# Patient Record
Sex: Male | Born: 1950 | Race: White | Hispanic: No | Marital: Married | Smoking: Never smoker
Health system: Southern US, Community
[De-identification: ages and names within clinical notes are randomized; demographics above are authoritative.]

## PROBLEM LIST (undated history)

## (undated) DIAGNOSIS — Z9889 Other specified postprocedural states: Secondary | ICD-10-CM

## (undated) DIAGNOSIS — R112 Nausea with vomiting, unspecified: Secondary | ICD-10-CM

## (undated) DIAGNOSIS — I10 Essential (primary) hypertension: Secondary | ICD-10-CM

## (undated) DIAGNOSIS — H409 Unspecified glaucoma: Secondary | ICD-10-CM

## (undated) DIAGNOSIS — N200 Calculus of kidney: Secondary | ICD-10-CM

## (undated) DIAGNOSIS — K8050 Calculus of bile duct without cholangitis or cholecystitis without obstruction: Secondary | ICD-10-CM

## (undated) HISTORY — PX: HERNIA REPAIR: SHX51

---

## 1997-11-07 ENCOUNTER — Ambulatory Visit (HOSPITAL_COMMUNITY): Admission: RE | Admit: 1997-11-07 | Discharge: 1997-11-07 | Payer: Self-pay | Admitting: Family Medicine

## 1998-07-05 ENCOUNTER — Ambulatory Visit (HOSPITAL_COMMUNITY): Admission: RE | Admit: 1998-07-05 | Discharge: 1998-07-05 | Payer: Self-pay | Admitting: Gastroenterology

## 1999-10-12 ENCOUNTER — Ambulatory Visit (HOSPITAL_COMMUNITY): Admission: RE | Admit: 1999-10-12 | Discharge: 1999-10-12 | Payer: Self-pay | Admitting: Family Medicine

## 1999-10-12 ENCOUNTER — Encounter: Payer: Self-pay | Admitting: Family Medicine

## 2001-03-22 ENCOUNTER — Encounter: Admission: RE | Admit: 2001-03-22 | Discharge: 2001-03-22 | Payer: Self-pay | Admitting: Otolaryngology

## 2001-03-22 ENCOUNTER — Encounter: Payer: Self-pay | Admitting: Otolaryngology

## 2001-07-24 ENCOUNTER — Encounter: Payer: Self-pay | Admitting: Family Medicine

## 2001-07-24 ENCOUNTER — Ambulatory Visit (HOSPITAL_COMMUNITY): Admission: RE | Admit: 2001-07-24 | Discharge: 2001-07-24 | Payer: Self-pay | Admitting: Family Medicine

## 2003-04-28 ENCOUNTER — Ambulatory Visit (HOSPITAL_COMMUNITY): Admission: RE | Admit: 2003-04-28 | Discharge: 2003-04-28 | Payer: Self-pay | Admitting: Family Medicine

## 2004-08-07 ENCOUNTER — Emergency Department (HOSPITAL_COMMUNITY): Admission: EM | Admit: 2004-08-07 | Discharge: 2004-08-07 | Payer: Self-pay | Admitting: Emergency Medicine

## 2006-07-20 ENCOUNTER — Ambulatory Visit (HOSPITAL_COMMUNITY): Admission: RE | Admit: 2006-07-20 | Discharge: 2006-07-20 | Payer: Self-pay | Admitting: General Surgery

## 2009-06-27 ENCOUNTER — Emergency Department (HOSPITAL_COMMUNITY): Admission: EM | Admit: 2009-06-27 | Discharge: 2009-06-27 | Payer: Self-pay | Admitting: Emergency Medicine

## 2010-03-04 ENCOUNTER — Encounter
Admission: RE | Admit: 2010-03-04 | Discharge: 2010-03-04 | Payer: Self-pay | Source: Home / Self Care | Attending: Family Medicine | Admitting: Family Medicine

## 2010-08-09 NOTE — Op Note (Signed)
NAME:  Darren Ponce NO.:  [id number]   MEDICAL RECORD NO.:  [id number]          PATIENT TYPE:  AMB   LOCATION:  DAY                          FACILITY:  [facility]   PHYSICIAN:  Leonie Man, M.D.   DATE OF BIRTH:  07/27/1950   DATE OF PROCEDURE:  07/20/2006  DATE OF DISCHARGE:                               OPERATIVE REPORT   PREOPERATIVE DIAGNOSIS:  Bilateral inguinal hernias.   POSTOPERATIVE DIAGNOSIS:  Bilateral inguinal hernias.   PROCEDURE:  Repair of bilateral inguinal hernias with mesh  Armanda Heritage).   SURGEON:  Leonie Man, M.D.   ASSISTANT:  OR tech.   ANESTHESIA:  General.   There are no specimens.   ESTIMATED BLOOD LOSS:  Minimal.   No complications and the patient returned to the PACU in excellent  condition.   [name] is a 60 year old recently retired man who noted a large left-  sided groin bulge after having moved some heavy equipment.  This has  persisted and he was referred for evaluation and treatment.  On  evaluation, the patient does have a large left-sided inguinal hernia  and, in fact, he does have a right-sided inguinal hernia.  The risks and  potential benefits of surgery as well as the details of the procedure  are discussed with the patient, he understands and gives his consent to  surgery.  He comes in to the operating room now for repair of his  bilateral inguinal hernias.   PROCEDURE:  The patient is positioned supine following the induction of  satisfactory general anesthesia and the lower abdomen is prepped and  draped included in the sterile operative field.  A transverse incision  in the lower abdominal crease on the left side is deepened through the  skin and subcutaneous tissue, dissecting down to the external oblique  aponeurosis.  This was opened up through the external inguinal ring with  retraction of the ilioinguinal nerve and elevation of the spermatic cord  which is held with a Penrose drain.   Dissection along the anteromedial  and superior aspect of the spermatic cord did not reveal an indirect  hernia.  However, on the inguinal floor, close to the pubic tubercle,  there was a rather large and prolapsed direct inguinal hernia.  This was  repaired by an onlay patch of Marlex polypropylene mesh which was sewn  in at the pubic tubercle with #2-0 Novofil and then carried up along the  conjoined tendon to the internal ring in the running fashion, and again  from the pubic tubercle along the shelving edge of Poupart's ligament up  to the internal ring.  The mesh was split so as to allow protrusion of  the spermatic cord.  The remaining details of the mesh were then sutured  down into the internal oblique muscle behind the cord.  The newly formed  internal ring was tested, noted to be patent without any constriction on  the spermatic cord.  Sponge instrument and sharp counts were then  verified on the left side.  The external oblique aponeurosis was closed  over the  spermatic cord with a running #2-0 Vicryl suture.  The Scarpa's  fascia was closed with a running #3-0 Vicryl suture and the skin closed  with running #4-0 Monocryl suture.   Attention was then turned to the right side where a symmetrically placed  transverse incision in the lower abdominal crease was carried down  through the skin and subcutaneous tissue, dissecting down to the  external oblique aponeurosis.  Again this was opened up through the  external inguinal ring with retraction of the ilioinguinal nerve and  elevation of the spermatic cord from the inguinal floor and held with a  Penrose drain.  Again dissection in the region of the anteromedial  aspect of the spermatic cord did not reveal an indirect sac.  The entire  floor of the right side inguinal canal was replaced with a rather large  defect in the transversalis fascia.  This was repaired by an onlay patch  of polypropylene mesh which again was sewn in at  the pubic tubercle with  #2-0 Novofil and carried up in a running suture along the conjoined  tendon up to the internal ring and again from the pubic tubercle up  along the shelving edge of Poupart's ligament to the internal ring.  The  mesh was split so as to allow proper protrusion off the cord through the  mesh and the tails of the mesh were sutured down to the internal oblique  muscle, thus forming a new internal ring.  Again the internal ring was  checked for it's patency and there was no stricturing off the cord.  The  external oblique aponeurosis was then closed after sponge and instrument  counts were verified.  This was done with a #2-0 Novofil suture.  The  Scarpa's fascia closed with a #3-0 Vicryl suture and the skin was closed  with #4-0 Monocryl suture.  Steri-Strips were placed in both wounds,  compressive dressings applied.  The anesthetic reversed and the patient  removed from the operating room to the recovery room in stable  condition.  He tolerated the procedure well.      Leonie Man, M.D.  Electronically Signed     PB/MEDQ  D:  07/20/2006  T:  07/20/2006  Job:  [id number]   cc:   Lindaann Slough, M.D.  Fax: [phone]

## 2012-08-20 ENCOUNTER — Emergency Department (HOSPITAL_COMMUNITY)
Admission: EM | Admit: 2012-08-20 | Discharge: 2012-08-20 | Disposition: A | Discharge status: Home / Self Care | Payer: 59 | Attending: Emergency Medicine | Admitting: Emergency Medicine

## 2012-08-20 ENCOUNTER — Emergency Department (HOSPITAL_COMMUNITY): Payer: 59

## 2012-08-20 ENCOUNTER — Encounter (HOSPITAL_COMMUNITY): Payer: Self-pay | Admitting: *Deleted

## 2012-08-20 DIAGNOSIS — R109 Unspecified abdominal pain: Secondary | ICD-10-CM

## 2012-08-20 DIAGNOSIS — M549 Dorsalgia, unspecified: Secondary | ICD-10-CM | POA: Insufficient documentation

## 2012-08-20 DIAGNOSIS — Z87442 Personal history of urinary calculi: Secondary | ICD-10-CM | POA: Insufficient documentation

## 2012-08-20 DIAGNOSIS — Z79899 Other long term (current) drug therapy: Secondary | ICD-10-CM | POA: Insufficient documentation

## 2012-08-20 DIAGNOSIS — R1011 Right upper quadrant pain: Secondary | ICD-10-CM | POA: Insufficient documentation

## 2012-08-20 HISTORY — DX: Calculus of kidney: N20.0

## 2012-08-20 LAB — COMPREHENSIVE METABOLIC PANEL
ALT: 18 U/L (ref 0–53)
Albumin: 4.1 g/dL (ref 3.5–5.2)
CO2: 25 mEq/L (ref 19–32)
Calcium: 9.5 mg/dL (ref 8.4–10.5)
Creatinine, Ser: 1.19 mg/dL (ref 0.50–1.35)
GFR calc Af Amer: 74 mL/min — ABNORMAL LOW (ref >90)
GFR calc non Af Amer: 64 mL/min — ABNORMAL LOW (ref >90)
Glucose, Bld: 167 mg/dL — ABNORMAL HIGH (ref 70–99)
Potassium: 4.4 mEq/L (ref 3.5–5.1)
Sodium: 136 mEq/L (ref 135–145)
Total Protein: 7.3 g/dL (ref 6.0–8.3)

## 2012-08-20 LAB — URINALYSIS, ROUTINE W REFLEX MICROSCOPIC
Bilirubin Urine: NEGATIVE
Glucose, UA: 100 mg/dL — AB
Hgb urine dipstick: NEGATIVE
Ketones, ur: NEGATIVE mg/dL
Leukocytes, UA: NEGATIVE
Nitrite: NEGATIVE
Protein, ur: NEGATIVE mg/dL
Specific Gravity, Urine: 1.022 (ref 1.005–1.030)
Urobilinogen, UA: 0.2 mg/dL (ref 0.0–1.0)
pH: 6 (ref 5.0–8.0)

## 2012-08-20 LAB — CBC WITH DIFFERENTIAL/PLATELET
Basophils Absolute: 0 10*3/uL (ref 0.0–0.1)
Eosinophils Absolute: 0 10*3/uL (ref 0.0–0.7)
Eosinophils Relative: 0 % (ref 0–5)
MCH: 31.5 pg (ref 26.0–34.0)
MCHC: 36.4 g/dL — ABNORMAL HIGH (ref 30.0–36.0)
Neutrophils Relative %: 87 % — ABNORMAL HIGH (ref 43–77)
Platelets: 154 10*3/uL (ref 150–400)
RDW: 12.4 % (ref 11.5–15.5)

## 2012-08-20 LAB — LIPASE, BLOOD: Lipase: 33 U/L (ref 11–59)

## 2012-08-20 MED ORDER — NAPROXEN 500 MG PO TABS: 500.0000 mg | ORAL_TABLET | Freq: Two times a day (BID) | ORAL | Status: DC

## 2012-08-20 MED ORDER — HYDROCODONE-ACETAMINOPHEN 5-325 MG PO TABS: 2.0000 | ORAL_TABLET | ORAL | Status: DC | PRN

## 2012-08-20 MED ORDER — SODIUM CHLORIDE 0.9 % IV SOLN
Freq: Once | INTRAVENOUS | Status: AC
  Administered 2012-08-20: 09:00:00 via INTRAVENOUS

## 2012-08-20 MED ORDER — KETOROLAC TROMETHAMINE 30 MG/ML IJ SOLN
30.0000 mg | Freq: Once | INTRAMUSCULAR | Status: AC
  Administered 2012-08-20: 30 mg via INTRAVENOUS
  Filled 2012-08-20: qty 1

## 2012-08-20 MED ORDER — ONDANSETRON HCL 4 MG/2ML IJ SOLN
4.0000 mg | Freq: Once | INTRAMUSCULAR | Status: AC
  Administered 2012-08-20: 4 mg via INTRAVENOUS
  Filled 2012-08-20: qty 2

## 2012-08-20 MED ORDER — MORPHINE SULFATE 4 MG/ML IJ SOLN
4.0000 mg | Freq: Once | INTRAMUSCULAR | Status: AC
  Administered 2012-08-20: 4 mg via INTRAVENOUS
  Filled 2012-08-20: qty 1

## 2012-08-20 NOTE — ED Provider Notes (Signed)
History     CSN: [id number]  Arrival date & time 08/20/12  0815   First MD Initiated Contact with Patient 08/20/12 [phone]      Chief Complaint  Patient presents with  . Abdominal Pain    (Consider location/radiation/quality/duration/timing/severity/associated sxs/prior treatment) HPI Comments: Pt is an otherwise healthy 62 y/o male with hx of KS 10 years ago and hx of hernia repair in the past (inguinal) who presents with CC of abd pain.  This was acute in onset at 1:30 AM.  This is poorly described but has not radiated, seems to stay in the same location and has no associated blood in the urine, no vomiting and no diarrhea.  He has no increased pain with coughing, no pain with urination and no dark colored urine.  No stool abnormalities.  This has been fairly constant.  He doesn't feel as though this is the same as prior KS.  No f/c/cough/sob.  He does have some back pain which is on the R side lower Thoracic area which he attributes to his work.    The history is provided by the patient.    Past Medical History  Diagnosis Date  . Kidney stone     Past Surgical History  Procedure Laterality Date  . Hernia repair      No family history on file.  History  Substance Use Topics  . Smoking status: Never Smoker   . Smokeless tobacco: Not on file  . Alcohol Use: Yes     Comment: occasionally      Review of Systems  All other systems reviewed and are negative.    Allergies  Codeine  Home Medications   Current Outpatient Rx  Name  Route  Sig  Dispense  Refill  . acetaminophen (TYLENOL) 500 MG tablet   Oral   Take 1,000 mg by mouth every 6 (six) hours as needed for pain.         . cyclobenzaprine (FLEXERIL) 10 MG tablet   Oral   Take 5 mg by mouth once.         Marland Kitchen HYDROcodone-acetaminophen (NORCO/VICODIN) 5-325 MG per tablet   Oral   Take 2 tablets by mouth every 4 (four) hours as needed for pain.   10 tablet   0   . naproxen (NAPROSYN) 500 MG tablet    Oral   Take 1 tablet (500 mg total) by mouth 2 (two) times daily with a meal.   30 tablet   0     BP 163/78  Pulse 66  Temp(Src) 97.8 F (36.6 C) (Oral)  Resp 20  Ht 5\' 9"  (1.753 m)  Wt 200 lb (90.719 kg)  BMI 29.52 kg/m2  SpO2 95%  Physical Exam  Nursing note and vitals reviewed. Constitutional: He appears well-developed and well-nourished. No distress.  HENT:  Head: Normocephalic and atraumatic.  Mouth/Throat: Oropharynx is clear and moist. No oropharyngeal exudate.  Eyes: Conjunctivae and EOM are normal. Pupils are equal, round, and reactive to light. Right eye exhibits no discharge. Left eye exhibits no discharge. No scleral icterus.  Neck: Normal range of motion. Neck supple. No JVD present. No thyromegaly present.  Cardiovascular: Normal rate, regular rhythm, normal heart sounds and intact distal pulses.  Exam reveals no gallop and no friction rub.   No murmur heard. Pulmonary/Chest: Effort normal and breath sounds normal. No respiratory distress. He has no wheezes. He has no rales.  Abdominal: Soft. Bowel sounds are normal. He exhibits no distension and no  mass. There is tenderness ( mild ttp to the RUQ - no guarding, very soft).  No pain at [facility] B point, no guarding, no masses, no peritoneal signs.  No CVA ttp.  Musculoskeletal: Normal range of motion. He exhibits no edema and no tenderness.  Lymphadenopathy:    He has no cervical adenopathy.  Neurological: He is alert. Coordination normal.  Skin: Skin is warm and dry. No rash noted. No erythema.  Psychiatric: He has a normal mood and affect. His behavior is normal.    ED Course  Procedures (including critical care time)  Labs Reviewed  CBC WITH DIFFERENTIAL - Abnormal; Notable for the following:    MCHC 36.4 (*)    Neutrophils Relative % 87 (*)    Neutro Abs 8.0 (*)    Lymphocytes Relative 9 (*)    All other components within normal limits  COMPREHENSIVE METABOLIC PANEL - Abnormal; Notable for the following:     Glucose, Bld 167 (*)    GFR calc non Af Amer 64 (*)    GFR calc Af Amer 74 (*)    All other components within normal limits  URINALYSIS, ROUTINE W REFLEX MICROSCOPIC - Abnormal; Notable for the following:    Glucose, UA 100 (*)    All other components within normal limits  LIPASE, BLOOD   Ct Abdomen Pelvis Wo Contrast  08/20/2012   *RADIOLOGY REPORT*  Clinical Data: Chronic back pain, right upper quadrant abdominal pain, history of renal calculi, vomiting  CT ABDOMEN AND PELVIS WITHOUT CONTRAST  Technique:  Multidetector CT imaging of the abdomen and pelvis was performed following the standard protocol without intravenous contrast.  Comparison: 03/04/2010  Findings: Minor dependent basilar atelectasis versus scarring.  No lower lobe pneumonia.  Normal heart size.  No pericardial or pleural effusion.  Abdomen:  Kidneys demonstrate no acute obstruction, hydronephrosis, perinephric inflammatory process, or ureteral calculus on either side.  Ureters are symmetric and decompressed.  Hypoattenuation of the liver parenchyma compatible with hepatic steatosis.  No biliary dilatation.  The gallbladder, biliary system, pancreas, spleen, accessory splenule, and adrenal glands are within normal limits for a noncontrast study and demonstrate no acute process.  Negative for bowel obstruction, dilatation, ileus, or free air. Normal appendix.  No abdominal free fluid, fluid collection, hemorrhage, abscess, or adenopathy.  Pelvis:  No pelvic free fluid, fluid collection, hemorrhage, abscess, or adenopathy.  Small fat containing left inguinal hernia as before.  Vascular calcifications noted.  Diffuse degenerative changes of the spine, pelvis and hips.  IMPRESSION: No acute obstructing urinary tract calculus or hydronephrosis.  Hepatic steatosis or fatty infiltration of the liver.  Normal appendix.  Small fat containing left inguinal hernia.   Original Report Authenticated By: Judie Petit. Shick, M.D.     1. Abdominal pain   2.  Back pain       MDM  The pt has no reproducible back tenderness, minimal abd ttp.  RUQ etiology consider cholecystitis / pancreatitis / KS.  meds ordered, UA and labs.  RUQ pain improved.  Labs normal - no hematuria - CT ordered to r/o large KS or other etiology of pain.  VS stable.  CT reviewed, no abnormal findings of acute concern - pt informed.  Stable for d/c.  Meds given in ED:  Medications  ketorolac (TORADOL) 30 MG/ML injection 30 mg (not administered)  0.9 %  sodium chloride infusion ( Intravenous New Bag/Given 08/20/12 0857)  morphine 4 MG/ML injection 4 mg (4 mg Intravenous Given 08/20/12 0857)  ondansetron (  ZOFRAN) injection 4 mg (4 mg Intravenous Given 08/20/12 0857)    New Prescriptions   HYDROCODONE-ACETAMINOPHEN (NORCO/VICODIN) 5-325 MG PER TABLET    Take 2 tablets by mouth every 4 (four) hours as needed for pain.   NAPROXEN (NAPROSYN) 500 MG TABLET    Take 1 tablet (500 mg total) by mouth 2 (two) times daily with a meal.        Vida Roller, MD 08/20/12 1006

## 2012-08-20 NOTE — ED Notes (Signed)
Bed:WA16<BR> Expected date:<BR> Expected time:<BR> Means of arrival:<BR> Comments:<BR> triage

## 2012-08-20 NOTE — ED Notes (Signed)
Pt reports chronic back pain and new onset of RUQ abd pain that began around 0130 this morning. Hx of kidney stones. Reports vomiting x1 this morning. Unable to describe pain, but states it is not sharp.

## 2012-09-01 ENCOUNTER — Inpatient Hospital Stay (HOSPITAL_COMMUNITY)
Admission: EM | Admit: 2012-09-01 | Discharge: 2012-09-05 | DRG: 418 | Disposition: A | Discharge status: Home / Self Care | Payer: 59 | Attending: Surgery | Admitting: Surgery

## 2012-09-01 ENCOUNTER — Emergency Department (HOSPITAL_COMMUNITY): Payer: 59

## 2012-09-01 ENCOUNTER — Inpatient Hospital Stay (HOSPITAL_COMMUNITY): Payer: 59

## 2012-09-01 ENCOUNTER — Encounter (HOSPITAL_COMMUNITY): Payer: Self-pay

## 2012-09-01 ENCOUNTER — Ambulatory Visit (HOSPITAL_COMMUNITY): Payer: 59

## 2012-09-01 DIAGNOSIS — R1013 Epigastric pain: Secondary | ICD-10-CM | POA: Diagnosis present

## 2012-09-01 DIAGNOSIS — K8020 Calculus of gallbladder without cholecystitis without obstruction: Secondary | ICD-10-CM

## 2012-09-01 DIAGNOSIS — K810 Acute cholecystitis: Secondary | ICD-10-CM | POA: Diagnosis present

## 2012-09-01 DIAGNOSIS — D72829 Elevated white blood cell count, unspecified: Secondary | ICD-10-CM | POA: Diagnosis present

## 2012-09-01 DIAGNOSIS — K8062 Calculus of gallbladder and bile duct with acute cholecystitis without obstruction: Principal | ICD-10-CM | POA: Diagnosis present

## 2012-09-01 DIAGNOSIS — R945 Abnormal results of liver function studies: Secondary | ICD-10-CM | POA: Diagnosis present

## 2012-09-01 DIAGNOSIS — K8050 Calculus of bile duct without cholangitis or cholecystitis without obstruction: Secondary | ICD-10-CM | POA: Diagnosis present

## 2012-09-01 DIAGNOSIS — I10 Essential (primary) hypertension: Secondary | ICD-10-CM | POA: Diagnosis present

## 2012-09-01 DIAGNOSIS — M549 Dorsalgia, unspecified: Secondary | ICD-10-CM

## 2012-09-01 DIAGNOSIS — R932 Abnormal findings on diagnostic imaging of liver and biliary tract: Secondary | ICD-10-CM

## 2012-09-01 DIAGNOSIS — R7989 Other specified abnormal findings of blood chemistry: Secondary | ICD-10-CM | POA: Diagnosis present

## 2012-09-01 DIAGNOSIS — K4090 Unilateral inguinal hernia, without obstruction or gangrene, not specified as recurrent: Secondary | ICD-10-CM | POA: Diagnosis present

## 2012-09-01 DIAGNOSIS — R748 Abnormal levels of other serum enzymes: Secondary | ICD-10-CM

## 2012-09-01 HISTORY — DX: Other specified postprocedural states: Z98.890

## 2012-09-01 HISTORY — DX: Nausea with vomiting, unspecified: R11.2

## 2012-09-01 HISTORY — DX: Calculus of bile duct without cholangitis or cholecystitis without obstruction: K80.50

## 2012-09-01 HISTORY — DX: Other specified postprocedural states: R11.2

## 2012-09-01 LAB — COMPREHENSIVE METABOLIC PANEL
ALT: 91 U/L — ABNORMAL HIGH (ref 0–53)
Albumin: 4.1 g/dL (ref 3.5–5.2)
Albumin: 3.8 g/dL (ref 3.5–5.2)
Alkaline Phosphatase: 130 U/L — ABNORMAL HIGH (ref 39–117)
BUN: 11 mg/dL (ref 6–23)
CO2: 26 mEq/L (ref 19–32)
Calcium: 10 mg/dL (ref 8.4–10.5)
Calcium: 9.3 mg/dL (ref 8.4–10.5)
Chloride: 99 mEq/L (ref 96–112)
Creatinine, Ser: 1.06 mg/dL (ref 0.50–1.35)
GFR calc Af Amer: 80 mL/min — ABNORMAL LOW (ref >90)
GFR calc Af Amer: 86 mL/min — ABNORMAL LOW (ref >90)
GFR calc non Af Amer: 69 mL/min — ABNORMAL LOW (ref >90)
Glucose, Bld: 157 mg/dL — ABNORMAL HIGH (ref 70–99)
Potassium: 3.9 mEq/L (ref 3.5–5.1)
Potassium: 4.4 mEq/L (ref 3.5–5.1)
Sodium: 136 mEq/L (ref 135–145)
Total Protein: 7.3 g/dL (ref 6.0–8.3)

## 2012-09-01 LAB — CBC WITH DIFFERENTIAL/PLATELET
Basophils Absolute: 0 10*3/uL (ref 0.0–0.1)
Eosinophils Absolute: 0 10*3/uL (ref 0.0–0.7)
HCT: 45.8 % (ref 39.0–52.0)
Hemoglobin: 15.7 g/dL (ref 13.0–17.0)
Lymphocytes Relative: 11 % — ABNORMAL LOW (ref 12–46)
Lymphocytes Relative: 6 % — ABNORMAL LOW (ref 12–46)
Lymphs Abs: 1.4 10*3/uL (ref 0.7–4.0)
Lymphs Abs: 0.6 10*3/uL — ABNORMAL LOW (ref 0.7–4.0)
MCH: 31.2 pg (ref 26.0–34.0)
Monocytes Absolute: 0.8 10*3/uL (ref 0.1–1.0)
Monocytes Relative: 6 % (ref 3–12)
Neutro Abs: 9.5 10*3/uL — ABNORMAL HIGH (ref 1.7–7.7)
Neutrophils Relative %: 81 % — ABNORMAL HIGH (ref 43–77)
Neutrophils Relative %: 88 % — ABNORMAL HIGH (ref 43–77)
Platelets: 191 10*3/uL (ref 150–400)
RBC: 5.03 MIL/uL (ref 4.22–5.81)
WBC: 12.5 10*3/uL — ABNORMAL HIGH (ref 4.0–10.5)
WBC: 10.8 10*3/uL — ABNORMAL HIGH (ref 4.0–10.5)

## 2012-09-01 LAB — URINALYSIS, ROUTINE W REFLEX MICROSCOPIC
Bilirubin Urine: NEGATIVE
Glucose, UA: NEGATIVE mg/dL
Hgb urine dipstick: NEGATIVE
Ketones, ur: NEGATIVE mg/dL
Protein, ur: NEGATIVE mg/dL
Specific Gravity, Urine: 1.021 (ref 1.005–1.030)
pH: 5 (ref 5.0–8.0)

## 2012-09-01 LAB — PHOSPHORUS: Phosphorus: 2.6 mg/dL (ref 2.3–4.6)

## 2012-09-01 LAB — PROTIME-INR
INR: 1.01 (ref 0.00–1.49)
Prothrombin Time: 13.2 seconds (ref 11.6–15.2)

## 2012-09-01 LAB — MAGNESIUM: Magnesium: 1.8 mg/dL (ref 1.5–2.5)

## 2012-09-01 LAB — APTT: aPTT: 27 seconds (ref 24–37)

## 2012-09-01 LAB — TROPONIN I: Troponin I: <0.3 ng/mL (ref <0.30)

## 2012-09-01 LAB — LACTIC ACID, PLASMA: Lactic Acid, Venous: 0.6 mmol/L (ref 0.5–2.2)

## 2012-09-01 MED ORDER — MORPHINE SULFATE 4 MG/ML IJ SOLN
3.6000 mg | Freq: Once | INTRAMUSCULAR | Status: AC
  Administered 2012-09-01: 3.6 mg via INTRAVENOUS

## 2012-09-01 MED ORDER — SODIUM CHLORIDE 0.9 % IV SOLN
INTRAVENOUS | Status: AC
  Administered 2012-09-01: 10:00:00 via INTRAVENOUS

## 2012-09-01 MED ORDER — MORPHINE SULFATE 4 MG/ML IJ SOLN
INTRAMUSCULAR | Status: AC
  Filled 2012-09-01: qty 1

## 2012-09-01 MED ORDER — HYDROMORPHONE HCL PF 1 MG/ML IJ SOLN
1.0000 mg | INTRAMUSCULAR | Status: DC | PRN
  Administered 2012-09-01 – 2012-09-02 (×8): 1 mg via INTRAVENOUS
  Administered 2012-09-03: 2 mg via INTRAVENOUS
  Administered 2012-09-04 (×2): 1 mg via INTRAVENOUS
  Filled 2012-09-01 (×7): qty 1
  Filled 2012-09-01: qty 2
  Filled 2012-09-01 (×3): qty 1

## 2012-09-01 MED ORDER — ONDANSETRON HCL 4 MG/2ML IJ SOLN
4.0000 mg | Freq: Four times a day (QID) | INTRAMUSCULAR | Status: DC | PRN
  Administered 2012-09-01 – 2012-09-03 (×4): 4 mg via INTRAVENOUS
  Filled 2012-09-01 (×4): qty 2

## 2012-09-01 MED ORDER — HYDROMORPHONE HCL PF 1 MG/ML IJ SOLN
1.0000 mg | Freq: Once | INTRAMUSCULAR | Status: AC
  Administered 2012-09-01: 1 mg via INTRAVENOUS
  Filled 2012-09-01: qty 1

## 2012-09-01 MED ORDER — IOHEXOL 300 MG/ML  SOLN
100.0000 mL | Freq: Once | INTRAMUSCULAR | Status: AC | PRN
  Administered 2012-09-01: 100 mL via INTRAVENOUS

## 2012-09-01 MED ORDER — HYDRALAZINE HCL 20 MG/ML IJ SOLN
5.0000 mg | Freq: Once | INTRAMUSCULAR | Status: AC
  Administered 2012-09-01: 5 mg via INTRAVENOUS
  Filled 2012-09-01: qty 0.25

## 2012-09-01 MED ORDER — HYDRALAZINE HCL 20 MG/ML IJ SOLN
10.0000 mg | Freq: Four times a day (QID) | INTRAMUSCULAR | Status: DC | PRN
  Administered 2012-09-02: 10 mg via INTRAVENOUS
  Filled 2012-09-01: qty 1

## 2012-09-01 MED ORDER — HYDROMORPHONE HCL PF 1 MG/ML IJ SOLN: 0.5000 mg | INTRAMUSCULAR | Status: DC | PRN

## 2012-09-01 MED ORDER — SODIUM CHLORIDE 0.9 % IJ SOLN
3.0000 mL | Freq: Two times a day (BID) | INTRAMUSCULAR | Status: DC
  Administered 2012-09-01 – 2012-09-02 (×2): 3 mL via INTRAVENOUS

## 2012-09-01 MED ORDER — ONDANSETRON HCL 4 MG/2ML IJ SOLN: 4.0000 mg | Freq: Four times a day (QID) | INTRAMUSCULAR | Status: DC | PRN

## 2012-09-01 MED ORDER — ONDANSETRON HCL 4 MG/2ML IJ SOLN: 4.0000 mg | Freq: Three times a day (TID) | INTRAMUSCULAR | Status: DC | PRN

## 2012-09-01 MED ORDER — HYDROMORPHONE HCL PF 1 MG/ML IJ SOLN: 1.0000 mg | INTRAMUSCULAR | Status: DC | PRN

## 2012-09-01 MED ORDER — ACETAMINOPHEN 325 MG PO TABS: 650.0000 mg | ORAL_TABLET | Freq: Four times a day (QID) | ORAL | Status: DC | PRN

## 2012-09-01 MED ORDER — TECHNETIUM TC 99M MEBROFENIN IV KIT
5.2000 | PACK | Freq: Once | INTRAVENOUS | Status: AC | PRN
  Administered 2012-09-01: 5.2 via INTRAVENOUS

## 2012-09-01 MED ORDER — ACETAMINOPHEN 650 MG RE SUPP: 650.0000 mg | Freq: Four times a day (QID) | RECTAL | Status: DC | PRN

## 2012-09-01 MED ORDER — KCL IN DEXTROSE-NACL 20-5-0.45 MEQ/L-%-% IV SOLN
INTRAVENOUS | Status: DC
  Administered 2012-09-01 – 2012-09-04 (×6): via INTRAVENOUS
  Filled 2012-09-01 (×9): qty 1000

## 2012-09-01 MED ORDER — SODIUM CHLORIDE 0.9 % IV SOLN
3.0000 g | Freq: Three times a day (TID) | INTRAVENOUS | Status: DC
  Administered 2012-09-01 – 2012-09-05 (×12): 3 g via INTRAVENOUS
  Filled 2012-09-01 (×14): qty 3

## 2012-09-01 MED ORDER — IOHEXOL 300 MG/ML  SOLN
50.0000 mL | Freq: Once | INTRAMUSCULAR | Status: AC | PRN
  Administered 2012-09-01: 50 mL via ORAL

## 2012-09-01 MED ORDER — ONDANSETRON HCL 4 MG/2ML IJ SOLN
4.0000 mg | Freq: Once | INTRAMUSCULAR | Status: AC
  Administered 2012-09-01: 4 mg via INTRAVENOUS
  Filled 2012-09-01: qty 2

## 2012-09-01 MED ORDER — ONDANSETRON HCL 4 MG PO TABS: 4.0000 mg | ORAL_TABLET | Freq: Four times a day (QID) | ORAL | Status: DC | PRN

## 2012-09-01 MED ORDER — PANTOPRAZOLE SODIUM 40 MG IV SOLR
40.0000 mg | Freq: Once | INTRAVENOUS | Status: AC
  Administered 2012-09-01: 40 mg via INTRAVENOUS
  Filled 2012-09-01: qty 40

## 2012-09-01 MED ORDER — SODIUM CHLORIDE 0.9 % IV SOLN
Freq: Once | INTRAVENOUS | Status: AC
  Administered 2012-09-01: 05:00:00 via INTRAVENOUS

## 2012-09-01 MED ORDER — PROMETHAZINE HCL 25 MG/ML IJ SOLN
25.0000 mg | Freq: Four times a day (QID) | INTRAMUSCULAR | Status: DC | PRN
  Administered 2012-09-01 – 2012-09-03 (×2): 25 mg via INTRAVENOUS
  Filled 2012-09-01 (×2): qty 1

## 2012-09-01 MED ORDER — GI COCKTAIL ~~LOC~~
30.0000 mL | Freq: Once | ORAL | Status: AC
  Administered 2012-09-01: 30 mL via ORAL
  Filled 2012-09-01: qty 30

## 2012-09-01 NOTE — ED Notes (Signed)
Pt reports he's unable to drink both cups of contrast.

## 2012-09-01 NOTE — H&P (Signed)
[facility] History and Physical  Darren Ponce AVW:[id number] DOB: 14-Dec-1950 DOA: 09/01/2012  Referring physician: ER physician PCP: Lolita Patella, MD   Chief Complaint: epigastric pain, nausea  HPI:  62 year old male with no significant past medical history who presented to [facility] ED with worsening epigastric pain associated with nausea but no vomiting. Patient describes epigastric pain is constant, 10 out of 10 and radiating to lower to mid back. This has significantly improved since he was given Dilaudid in ED. Patient reported his pain is aggravated with food and analgesics at home did not provide symptomatic relief. Patient reports no fever or chills. He did have similar symptoms in past but less severe than on this presentation. Patient reports no blood in the stool, no diarrhea and no constipation. No other complaints such as chest pain, shortness of breath or palpitations. No lightheadedness, dizziness or loss of consciousness. In ED vital signs are as follows:  BP 122/107, HR 72, T 97.7 F. blood pressure subsequently increased to 204/100 and after hydralazine 5 mg IV 1 dose BP is 142/82. Further evaluation included abdominal x-ray which showed possible early obstruction. Abdominal ultrasound showed multiple gallstones with trace pericholecystic fluid concerning for possible development of early acute cholecystitis. CBC was significant for mild leukocytosis of 12.5. BMP was essentially unremarkable.  Assessment and Plan:  Principal Problem:   Epigastric pain - Possible development of early acute cholecystitis based on abdominal ultrasound versus small bowel obstruction - Appreciate surgery following and their recommendations. Plan is for HIDA scan today - Keep n.p.o., IV fluids were started in ED. This will be discontinued due to vascular congestion identified on abdominal x-ray. - Analgesia with Dilaudid 1-2 mg every 2 hours IV as needed for severe pain. Continue when  necessary antiemetics Active Problems: Accelerated hypertension - Likely secondary to pain - Patient was given hydralazine 5 mg IV 1 dose and blood pressure is currently 136/84 - Hydralazine 10 mg IV every 6 hours as needed for systolic blood pressure above 829   Leukocytosis - Reactive versus acute cholecystitis - will follow up with surgery if paln is to have patient start antibiotics    Abnormal LFTs - Secondary to possible early acute cholecystitis - Followup CMP in the morning  Code Status: Full Family Communication: Pt at bedside Disposition Plan: Admit for further evaluation  Manson Passey, MD  [facility] Pager [phone]  If 7PM-7AM, please contact night-coverage [email or web address] Password [email or web address] 09/01/2012, 10:13 AM  Review of Systems:  Constitutional: Negative for fever, chills and malaise/fatigue. Negative for diaphoresis.  HENT: Negative for hearing loss, ear pain, nosebleeds, congestion, sore throat, neck pain, tinnitus and ear discharge.   Eyes: Negative for blurred vision, double vision, photophobia, pain, discharge and redness.  Respiratory: Negative for cough, hemoptysis, sputum production, shortness of breath, wheezing and stridor.   Cardiovascular: Negative for chest pain, palpitations, orthopnea, claudication and leg swelling.  Gastrointestinal: per HPI  Genitourinary: Negative for dysuria, urgency, frequency, hematuria and flank pain.  Musculoskeletal: Negative for myalgias, back pain, joint pain and falls.  Skin: Negative for itching and rash.  Neurological: Negative for dizziness and weakness. Negative for tingling, tremors, sensory change, speech change, focal weakness, loss of consciousness and headaches.  Endo/Heme/Allergies: Negative for environmental allergies and polydipsia. Does not bruise/bleed easily.  Psychiatric/Behavioral: Negative for suicidal ideas. The patient is not nervous/anxious.      Past Medical History  Diagnosis Date  . Kidney stone    Past  Surgical History  Procedure Laterality Date  . Hernia  repair     Social History:  reports that he has never smoked. He does not have any smokeless tobacco history on file. He reports that  drinks alcohol. He reports that he does not use illicit drugs.  Allergies  Allergen Reactions  . Codeine     vomiting    Family History: Family medical history significant for HTN  Prior to Admission medications   Medication Sig Start Date End Date Taking? Authorizing Provider  acetaminophen (TYLENOL) 500 MG tablet Take 1,000 mg by mouth every 6 (six) hours as needed for pain.   Yes Historical Provider, MD   Physical Exam: Filed Vitals:   09/01/12 0315 09/01/12 0751  BP: 122/107 204/100  Pulse: 72 71  Temp: 97.7 F (36.5 C)   TempSrc: Oral   Resp: 22 22  SpO2: 97% 95%    Physical Exam  Constitutional: Appears well-developed and well-nourished. No distress.  HENT: Normocephalic. External right and left ear normal. Oropharynx is clear and moist.  Eyes: Conjunctivae and EOM are normal. PERRLA, no scleral icterus.  Neck: Normal ROM. Neck supple. No JVD. No tracheal deviation. No thyromegaly.  CVS: RRR, S1/S2 +, no murmurs, no gallops, no carotid bruit.  Pulmonary: Effort and breath sounds normal, no stridor, rhonchi, wheezes, rales.  Abdominal: Soft. BS +,  no distension, tenderness, rebound or guarding.  Musculoskeletal: Normal range of motion. No edema and no tenderness.  Lymphadenopathy: No lymphadenopathy noted, cervical, inguinal. Neuro: Alert. Normal reflexes, muscle tone coordination. No cranial nerve deficit. Skin: Skin is warm and dry. No rash noted. Not diaphoretic. No erythema. No pallor.  Psychiatric: Normal mood and affect. Behavior, judgment, thought content normal.   Labs on Admission:  Basic Metabolic Panel:  Recent Labs Lab 09/01/12 0430  NA 136  K 3.9  CL 99  CO2 26  GLUCOSE 165*  BUN 14  CREATININE 1.12  CALCIUM 10.0   Liver Function Tests:  Recent  Labs Lab 09/01/12 0430  AST 109*  ALT 91*  ALKPHOS 132*  BILITOT 0.4  PROT 7.8  ALBUMIN 4.1    Recent Labs Lab 09/01/12 0430  LIPASE 23   No results found for this basename: AMMONIA,  in the last 168 hours CBC:  Recent Labs Lab 09/01/12 0430  WBC 12.5*  NEUTROABS 10.1*  HGB 16.1  HCT 45.8  MCV 86.3  PLT 181   Cardiac Enzymes:  Recent Labs Lab 09/01/12 0820  TROPONINI <0.30   BNP: No components found with this basename: POCBNP,  CBG: No results found for this basename: GLUCAP,  in the last 168 hours  Radiological Exams on Admission: US Abdomen Complete 09/01/2012   *  IMPRESSION: Multiple gallstones with trace pericholecystic fluid.  No gallbladder wall thickening.  Sonographic Murphy's cannot be assessed in the setting of pain medication.  This appearance is equivocal.  Early acute cholecystitis is possible.  Consider hepatobiliary nuclear medicine scan for further evaluation as clinically warranted.  Hepatic steatosis with focal fatty sparing.   Original Report Authenticated By: Charline Bills, M.D.   Dg Abd Acute W/chest 09/01/2012   *IMPRESSION: Mildly prominent gas-filled loops of small bowel in the left upper quadrant with air-fluid levels suggest early obstruction. Pulmonary vascular congestion and interstitial edema seen in the lungs.   Original Report Authenticated By: Burman Nieves, M.D.    EKG: Normal sinus rhythm, no ST/T wave changes       \

## 2012-09-01 NOTE — ED Provider Notes (Signed)
I assumed care of patient from Dr. Preston Fleeting at 7:30 AM. Intermittent epigastric pain radiating to the back for several weeks. Ultrasound pending.  Ultrasound shows layering gallstones in the gallbladder. Slight pericholecystic fluid. Discussed with Dr. Ezzard Standing who has evaluated the patient. He is not convinced patient's symptoms are from his gallbladder. Recommend HIDA scan and  medical admission.  Glynn Octave, MD 09/01/12 [phone]

## 2012-09-01 NOTE — ED Notes (Signed)
Pt states still having back pain.

## 2012-09-01 NOTE — Progress Notes (Signed)
ANTIBIOTIC CONSULT NOTE - INITIAL  Pharmacy Consult for Unasyn Indication: Cholescystitis  Allergies  Allergen Reactions  . Codeine     vomiting   Patient Measurements: Height: 5\' 9"  (175.3 cm) Weight: 200 lb (90.719 kg) IBW/kg (Calculated) : 70.7  Vital Signs: Temp: 97.5 F (36.4 C) (06/11 1154) Temp src: Oral (06/11 1154) BP: 136/84 mmHg (06/11 1154) Pulse Rate: 69 (06/11 1154) Intake/Output from previous day:   Intake/Output from this shift:    Labs:  Recent Labs  09/01/12 0430 09/01/12 1235  WBC 12.5* 10.8*  HGB 16.1 15.7  PLT 181 191  CREATININE 1.12 1.06   Estimated Creatinine Clearance: 81.5 ml/min (by C-G formula based on Cr of 1.06). No results found for this basename: VANCOTROUGH, VANCOPEAK, VANCORANDOM, GENTTROUGH, GENTPEAK, GENTRANDOM, TOBRATROUGH, TOBRAPEAK, TOBRARND, AMIKACINPEAK, AMIKACINTROU, AMIKACIN,  in the last 72 hours   Microbiology: No results found for this or any previous visit (from the past 720 hour(s)).  Medical History: Past Medical History  Diagnosis Date  . Kidney stone   . PONV (postoperative nausea and vomiting)     Medications:  Anti-infectives   Start     Dose/Rate Route Frequency Ordered Stop   09/01/12 1800  Ampicillin-Sulbactam (UNASYN) 3 g in sodium chloride 0.9 % 100 mL IVPB     3 g 100 mL/hr over 60 Minutes Intravenous Every 8 hours 09/01/12 1745       Assessment: 62yo M with epigastric pain and nausea. Possible SBO vs. early cholecystitis.   SCr 1.1, CrCl 35ml/min.  Plan:   Unasyn 3g IV q8h.  Follow up renal fxn and any culture results.  Charolotte Eke, PharmD, pager [phone]. 09/01/2012,5:48 PM.

## 2012-09-01 NOTE — ED Notes (Signed)
Called to give report, rn reports giving medication at this time and will return phone call

## 2012-09-01 NOTE — Consult Note (Signed)
Reason for Consult: Abdominal pain, nausea, abnormal abdominal U/S Referring Physician: Dr. Kathie Rhodes. [place]ancour  Darren Ponce is an 62 y.o. male.  HPI: 62 yr old male who presented to [facility] with less then 12 hours of epigastric abdominal pain. This started last night and has gotten much worse this am.  He was in the ER at end of May with similar symptoms but not as severe as today.  This is associated with mid back pain and nausea.  He has been having symptoms intermittently that are less severe over the last 2-3 weeks.  This symptoms come on at any time and are not associated with eating.  No history of this prior to 2 weeks ago.  He has been constipated the last 2 weeks as well and reports no BM in 3 days.  He denies vomiting and denies blood per rectum or dark stools.  He relates to me that he has some chronic kidney disease but do not see this in his records.  He has had multiple kidney stones but this pain is different.  He does have GERD and was on Protonix in the past but not currently taking it.  He denies any SOB or radiating pain to chest.  He does not normally have high blood pressure.  He reports that he does see a primary care MD regularly.    Past Medical History  Diagnosis Date  . Kidney stone     Past Surgical History  Procedure Laterality Date  . Hernia repair      History reviewed. No pertinent family history.  Social History:  reports that he has never smoked. He does not have any smokeless tobacco history on file. He reports that  drinks alcohol. He reports that he does not use illicit drugs.  Allergies:  Allergies  Allergen Reactions  . Codeine     vomiting    Medications: I have reviewed the patient's current medications.  Results for orders placed during the hospital encounter of 09/01/12 (from the past 48 hour(s))  CBC WITH DIFFERENTIAL     Status: Abnormal   Collection Time    09/01/12  4:30 AM      Result Value Range   WBC 12.5 (*) 4.0 - 10.5 K/uL   RBC 5.31  4.22 -  5.81 MIL/uL   Hemoglobin 16.1  13.0 - 17.0 g/dL   HCT 16.1  09.6 - 04.5 %   MCV 86.3  78.0 - 100.0 fL   MCH 30.3  26.0 - 34.0 pg   MCHC 35.2  30.0 - 36.0 g/dL   RDW 40.9  81.1 - 91.4 %   Platelets 181  150 - 400 K/uL   Neutrophils Relative % 81 (*) 43 - 77 %   Neutro Abs 10.1 (*) 1.7 - 7.7 K/uL   Lymphocytes Relative 11 (*) 12 - 46 %   Lymphs Abs 1.4  0.7 - 4.0 K/uL   Monocytes Relative 7  3 - 12 %   Monocytes Absolute 0.8  0.1 - 1.0 K/uL   Eosinophils Relative 1  0 - 5 %   Eosinophils Absolute 0.2  0.0 - 0.7 K/uL   Basophils Relative 0  0 - 1 %   Basophils Absolute 0.0  0.0 - 0.1 K/uL  COMPREHENSIVE METABOLIC PANEL     Status: Abnormal   Collection Time    09/01/12  4:30 AM      Result Value Range   Sodium 136  135 - 145 mEq/L  Potassium 3.9  3.5 - 5.1 mEq/L   Chloride 99  96 - 112 mEq/L   CO2 26  19 - 32 mEq/L   Glucose, Bld 165 (*) 70 - 99 mg/dL   BUN 14  6 - 23 mg/dL   Creatinine, Ser 1.47  0.50 - 1.35 mg/dL   Calcium 82.9  8.4 - 56.2 mg/dL   Total Protein 7.8  6.0 - 8.3 g/dL   Albumin 4.1  3.5 - 5.2 g/dL   AST 130 (*) 0 - 37 U/L   ALT 91 (*) 0 - 53 U/L   Alkaline Phosphatase 132 (*) 39 - 117 U/L   Total Bilirubin 0.4  0.3 - 1.2 mg/dL   GFR calc non Af Amer 69 (*) >90 mL/min   GFR calc Af Amer 80 (*) >90 mL/min   Comment:            The eGFR has been calculated     using the CKD EPI equation.     This calculation has not been     validated in all clinical     situations.     eGFR's persistently     <90 mL/min signify     possible Chronic Kidney Disease.  LIPASE, BLOOD     Status: None   Collection Time    09/01/12  4:30 AM      Result Value Range   Lipase 23  11 - 59 U/L  URINALYSIS, ROUTINE W REFLEX MICROSCOPIC     Status: None   Collection Time    09/01/12  5:34 AM      Result Value Range   Color, Urine YELLOW  YELLOW   APPearance CLEAR  CLEAR   Specific Gravity, Urine 1.021  1.005 - 1.030   pH 5.0  5.0 - 8.0   Glucose, UA NEGATIVE  NEGATIVE mg/dL    Hgb urine dipstick NEGATIVE  NEGATIVE   Bilirubin Urine NEGATIVE  NEGATIVE   Ketones, ur NEGATIVE  NEGATIVE mg/dL   Protein, ur NEGATIVE  NEGATIVE mg/dL   Urobilinogen, UA 0.2  0.0 - 1.0 mg/dL   Nitrite NEGATIVE  NEGATIVE   Leukocytes, UA NEGATIVE  NEGATIVE   Comment: MICROSCOPIC NOT DONE ON URINES WITH NEGATIVE PROTEIN, BLOOD, LEUKOCYTES, NITRITE, OR GLUCOSE <1000 mg/dL.  ACETAMINOPHEN LEVEL     Status: None   Collection Time    09/01/12  8:20 AM      Result Value Range   Acetaminophen (Tylenol), Serum <15.0  10 - 30 ug/mL   Comment:            THERAPEUTIC CONCENTRATIONS VARY     SIGNIFICANTLY. A RANGE OF 10-30     ug/mL MAY BE AN EFFECTIVE     CONCENTRATION FOR MANY PATIENTS.     HOWEVER, SOME ARE BEST TREATED     AT CONCENTRATIONS OUTSIDE THIS     RANGE.     ACETAMINOPHEN CONCENTRATIONS     >150 ug/mL AT 4 HOURS AFTER     INGESTION AND >50 ug/mL AT 12     HOURS AFTER INGESTION ARE     OFTEN ASSOCIATED WITH TOXIC     REACTIONS.  TROPONIN I     Status: None   Collection Time    09/01/12  8:20 AM      Result Value Range   Troponin I <0.30  <0.30 ng/mL   Comment:            Due to the release kinetics of cTnI,  a negative result within the first hours     of the onset of symptoms does not rule out     myocardial infarction with certainty.     If myocardial infarction is still suspected,     repeat the test at appropriate intervals.  LACTIC ACID, PLASMA     Status: None   Collection Time    09/01/12  8:20 AM      Result Value Range   Lactic Acid, Venous 0.6  0.5 - 2.2 mmol/L    US Abdomen Complete  09/01/2012   *RADIOLOGY REPORT*  Clinical Data:  Abdominal pain, constipation  COMPLETE ABDOMINAL ULTRASOUND  Comparison:  CT abdomen pelvis dated 08/20/2012  Findings:  Gallbladder:  Multiple layering gallstones.  No gallbladder wall thickening.  Trace pericholecystic fluid.  Sonographic Murphy's cannot be assessed in the setting of pain medication.  Common bile duct:   Measures 7 mm.  No choledocholithiasis is seen.  Liver:  Hepatic steatosis.  Focal fatty sparing in the gallbladder fossa.  IVC:  Not visualized due to overlying bowel gas.  Pancreas:  Not visualized due to overlying bowel gas.  Spleen:  Measures 11.6 cm.  Right Kidney:  Measures 10.8 cm.  No mass or hydronephrosis.  Left Kidney:  Measures 11.2 cm.  No mass or hydronephrosis.  Abdominal aorta:  Not visualized due to overlying bowel gas.  IMPRESSION: Multiple gallstones with trace pericholecystic fluid.  No gallbladder wall thickening.  Sonographic Murphy's cannot be assessed in the setting of pain medication.  This appearance is equivocal.  Early acute cholecystitis is possible.  Consider hepatobiliary nuclear medicine scan for further evaluation as clinically warranted.  Hepatic steatosis with focal fatty sparing.   Original Report Authenticated By: Charline Bills, M.D.   Dg Abd Acute W/chest  09/01/2012   *RADIOLOGY REPORT*  Clinical Data: Mid abdominal pain off and on for 2 weeks.  Severe abdominal pain this morning.  Nausea.  ACUTE ABDOMEN SERIES (ABDOMEN 2 VIEW & CHEST 1 VIEW)  Comparison: CT abdomen and pelvis 08/20/2012.  Chest 04/29/2006  Findings: Borderline heart size but with mild pulmonary vascular congestion.  Interstitial changes in the lungs may represent edema. Linear fibrosis or atelectasis in the right lung base.  No pneumothorax.  No blunting of costophrenic angles.  Scattered gas and stool in the colon without distension.  There are a few mildly distended gas filled loops of small bowel in the left abdomen with air-fluid levels.  Changes suggest early or partial obstruction.  No free intra-abdominal air.  No radiopaque stones. Visualized bones appear intact.  IMPRESSION: Mildly prominent gas-filled loops of small bowel in the left upper quadrant with air-fluid levels suggest early obstruction. Pulmonary vascular congestion and interstitial edema seen in the lungs.   Original Report  Authenticated By: Burman Nieves, M.D.    Review of Systems  Constitutional: Negative.   HENT: Negative.  Negative for neck pain.   Eyes: Negative.   Respiratory: Negative.   Cardiovascular: Negative.   Gastrointestinal: Positive for heartburn, nausea, abdominal pain and constipation. Negative for vomiting, blood in stool and melena.  Genitourinary: Negative.   Musculoskeletal: Positive for back pain. Negative for myalgias.  Skin: Negative.   Neurological: Negative.   Endo/Heme/Allergies: Negative.   Psychiatric/Behavioral: Negative.    Blood pressure 204/100, pulse 71, temperature 97.7 F (36.5 C), temperature source Oral, resp. rate 22, SpO2 95.00%. Physical Exam  Nursing note and vitals reviewed. Constitutional: He is oriented to person, place, and time. He appears well-developed and  well-nourished. No distress.  HENT:  Head: Normocephalic and atraumatic.  Eyes: Conjunctivae are normal. Pupils are equal, round, and reactive to light.  Neck: Normal range of motion. Neck supple.  Cardiovascular: Normal rate and regular rhythm.   2+ right pedal pulses Trace left pedal pulses Both feet warm and well perfused  Respiratory: Effort normal and breath sounds normal.  Mild decrease in base   GI: Soft. Bowel sounds are normal. He exhibits no distension and no mass. There is tenderness (Epigastric mostly). There is no rebound and no guarding.  Pain mostly in epigastric region, min tenderness in RUQ, no lower abd pain  Musculoskeletal: Normal range of motion. He exhibits no edema.  Neurological: He is alert and oriented to person, place, and time.  Skin: Skin is warm and dry.  Psychiatric: He has a normal mood and affect. His behavior is normal. Thought content normal.    Assessment/Plan: 1. Epigastric abdominal pain with nausea: currently unknown etiology, U/S did show gallstones and trace pericholecystic fluid but no gallbladder wall thickening.  His symptoms are not classic for  bilary colic however given the elevated transaminases we will order a HIDA scan.  A CT down in May did not show an acute abdominal abnormality to explain his stymptoms then however his symptoms are worse this time and the abd film done today suggest a pattern of early bowel obstruction and large stool burden on the right.  We agree with repeating his CT as well given that his abdominal pain is not classic.  We recommend medicine eval and admit given the patient's elevated blood pressure.  ?cardiac evaluation given no previous h/o HTN.  We will continue to follow the patient and should his HIDA scan be positive then this would support laparoscopic cholecystectomy.  If it is negative then more work up would be needed, possibly GI consult, ?PUD/gastritis.   WHITE, ELIZABETH 09/01/2012, 9:52 AM   Agree with above. HIDA scan shows non viz GB c/w cholecystitis. Will plan cholecystectomy tomorrow.  Ovidio Kin, MD, [facility] Surgery Pager: [phone] Office phone:  [phone].

## 2012-09-01 NOTE — Progress Notes (Signed)
MD notified of elevated BP.  Pt states he is having pain. Dilaudid administered 1mg . Pt resting in bed. Will recheck BP and continue to monitor.

## 2012-09-01 NOTE — ED Notes (Signed)
Pt c/o abd pain and back pain for a couple weeks.  Has been seen her for same.  Constipated for the past couple days.  Pt states pain got really bad an hour or so ago.  Pt states back hurts too.

## 2012-09-01 NOTE — Progress Notes (Signed)
Dr. Ezzard Standing has recently examined the pt. And spent an extended period of time with him instructing him about undergoing cholecystectomy tomorrow and fielding questions from him and his family. A male relative is happy to recognize Dr. Ezzard Standing as the doctor who performed her cholecystectomy in 1995.

## 2012-09-01 NOTE — ED Notes (Signed)
Per NM pt needs to be NPO for procedure and should not have any opiates prior to procedure. Reports procedure will be done around 2p.

## 2012-09-01 NOTE — ED Notes (Signed)
US in progress

## 2012-09-01 NOTE — ED Provider Notes (Signed)
History     CSN: [id number]  Arrival date & time 09/01/12  0305   First MD Initiated Contact with Patient 09/01/12 0403      Chief Complaint  Patient presents with  . Abdominal Pain    (Consider location/radiation/quality/duration/timing/severity/associated sxs/prior treatment) Patient is a 62 y.o. male presenting with abdominal pain. The history is provided by the patient.  Abdominal Pain Associated symptoms include abdominal pain.  He had onset last night of pain in his epigastric area which got much worse today. Pain has been severe since this morning and he rates the pain at 10/10. He has difficulty characterizing the pain. There is no radiation of pain. There is nausea but no vomiting. He denies any diarrhea. Pain is sometimes worse with a deep breath but not consistently. He tried taking a dose of Vicodin last night but it did not help. He has also been having pain in his midback which is something in his head for several weeks and is not significantly changed from baseline. He denies ethanol use and tobacco use. He had been seen in the ED recently and prescribed naproxen, but he states that he did not get the prescription filled because he had been told in the past not to take it because of potential kidney problems.  Past Medical History  Diagnosis Date  . Kidney stone     Past Surgical History  Procedure Laterality Date  . Hernia repair      History reviewed. No pertinent family history.  History  Substance Use Topics  . Smoking status: Never Smoker   . Smokeless tobacco: Not on file  . Alcohol Use: Yes     Comment: occasionally      Review of Systems  Gastrointestinal: Positive for abdominal pain.  All other systems reviewed and are negative.    Allergies  Codeine  Home Medications   Current Outpatient Rx  Name  Route  Sig  Dispense  Refill  . acetaminophen (TYLENOL) 500 MG tablet   Oral   Take 1,000 mg by mouth every 6 (six) hours as needed for  pain.         . cyclobenzaprine (FLEXERIL) 10 MG tablet   Oral   Take 5 mg by mouth once.         Marland Kitchen HYDROcodone-acetaminophen (NORCO/VICODIN) 5-325 MG per tablet   Oral   Take 2 tablets by mouth every 4 (four) hours as needed for pain.   10 tablet   0   . naproxen (NAPROSYN) 500 MG tablet   Oral   Take 1 tablet (500 mg total) by mouth 2 (two) times daily with a meal.   30 tablet   0     BP 122/107  Pulse 72  Temp(Src) 97.7 F (36.5 C) (Oral)  Resp 22  SpO2 97%  Physical Exam  Nursing note and vitals reviewed.  62 year old male, who appears uncomfortable, but is in no acute distress. Vital signs are significant for diastolic hypertension with blood pressure 122/107, tachypnea with respiratory rate of 22. Oxygen saturation is 97%, which is normal. Head is normocephalic and atraumatic. PERRLA, EOMI. Oropharynx is clear. Neck is nontender and supple without adenopathy or JVD. Back is nontender and there is no CVA tenderness. Lungs are clear without rales, wheezes, or rhonchi. Chest is nontender. Heart has regular rate and rhythm without murmur. Abdomen is soft, flat, with moderate epigastric tenderness. There is no rebound or guarding. There are no masses or hepatosplenomegaly and peristalsis is  hypoactive. Extremities have no cyanosis or edema, full range of motion is present. Skin is warm and dry without rash. Neurologic: Mental status is normal, cranial nerves are intact, there are no motor or sensory deficits.  ED Course  Procedures (including critical care time)  Results for orders placed during the hospital encounter of 09/01/12  CBC WITH DIFFERENTIAL      Result Value Range   WBC 12.5 (*) 4.0 - 10.5 K/uL   RBC 5.31  4.22 - 5.81 MIL/uL   Hemoglobin 16.1  13.0 - 17.0 g/dL   HCT 02.7  25.3 - 66.4 %   MCV 86.3  78.0 - 100.0 fL   MCH 30.3  26.0 - 34.0 pg   MCHC 35.2  30.0 - 36.0 g/dL   RDW 40.3  47.4 - 25.9 %   Platelets 181  150 - 400 K/uL   Neutrophils  Relative % 81 (*) 43 - 77 %   Neutro Abs 10.1 (*) 1.7 - 7.7 K/uL   Lymphocytes Relative 11 (*) 12 - 46 %   Lymphs Abs 1.4  0.7 - 4.0 K/uL   Monocytes Relative 7  3 - 12 %   Monocytes Absolute 0.8  0.1 - 1.0 K/uL   Eosinophils Relative 1  0 - 5 %   Eosinophils Absolute 0.2  0.0 - 0.7 K/uL   Basophils Relative 0  0 - 1 %   Basophils Absolute 0.0  0.0 - 0.1 K/uL  COMPREHENSIVE METABOLIC PANEL      Result Value Range   Sodium 136  135 - 145 mEq/L   Potassium 3.9  3.5 - 5.1 mEq/L   Chloride 99  96 - 112 mEq/L   CO2 26  19 - 32 mEq/L   Glucose, Bld 165 (*) 70 - 99 mg/dL   BUN 14  6 - 23 mg/dL   Creatinine, Ser 5.63  0.50 - 1.35 mg/dL   Calcium 87.5  8.4 - 64.3 mg/dL   Total Protein 7.8  6.0 - 8.3 g/dL   Albumin 4.1  3.5 - 5.2 g/dL   AST 329 (*) 0 - 37 U/L   ALT 91 (*) 0 - 53 U/L   Alkaline Phosphatase 132 (*) 39 - 117 U/L   Total Bilirubin 0.4  0.3 - 1.2 mg/dL   GFR calc non Af Amer 69 (*) >90 mL/min   GFR calc Af Amer 80 (*) >90 mL/min  LIPASE, BLOOD      Result Value Range   Lipase 23  11 - 59 U/L  URINALYSIS, ROUTINE W REFLEX MICROSCOPIC      Result Value Range   Color, Urine YELLOW  YELLOW   APPearance CLEAR  CLEAR   Specific Gravity, Urine 1.021  1.005 - 1.030   pH 5.0  5.0 - 8.0   Glucose, UA NEGATIVE  NEGATIVE mg/dL   Hgb urine dipstick NEGATIVE  NEGATIVE   Bilirubin Urine NEGATIVE  NEGATIVE   Ketones, ur NEGATIVE  NEGATIVE mg/dL   Protein, ur NEGATIVE  NEGATIVE mg/dL   Urobilinogen, UA 0.2  0.0 - 1.0 mg/dL   Nitrite NEGATIVE  NEGATIVE   Leukocytes, UA NEGATIVE  NEGATIVE    Dg Abd Acute W/chest  09/01/2012   *RADIOLOGY REPORT*  Clinical Data: Mid abdominal pain off and on for 2 weeks.  Severe abdominal pain this morning.  Nausea.  ACUTE ABDOMEN SERIES (ABDOMEN 2 VIEW & CHEST 1 VIEW)  Comparison: CT abdomen and pelvis 08/20/2012.  Chest 04/29/2006  Findings: Borderline heart  size but with mild pulmonary vascular congestion.  Interstitial changes in the lungs may  represent edema. Linear fibrosis or atelectasis in the right lung base.  No pneumothorax.  No blunting of costophrenic angles.  Scattered gas and stool in the colon without distension.  There are a few mildly distended gas filled loops of small bowel in the left abdomen with air-fluid levels.  Changes suggest early or partial obstruction.  No free intra-abdominal air.  No radiopaque stones. Visualized bones appear intact.  IMPRESSION: Mildly prominent gas-filled loops of small bowel in the left upper quadrant with air-fluid levels suggest early obstruction. Pulmonary vascular congestion and interstitial edema seen in the lungs.   Original Report Authenticated By: Burman Nieves, M.D.    Images viewed by me.  ECG shows normal sinus rhythm with a rate of 68, no ectopy. Normal axis. Normal P wave. Normal QRS. Normal intervals. Normal ST and T waves. Impression: normal ECG. When compared with ECG of 07/17/2006, no significant changes are seen.  1. Epigastric pain   2. Back pain   3. Elevated liver enzymes       MDM  Epigastric pain of uncertain cause. Lids are reviewed and he had an essentially negative CT scan of his abdomen and pelvis 2 weeks ago. Current symptoms seem most consistent with gastritis or ulcer. Screening labs are obtained and is given a dose of pantoprazole and a GI cocktail as well as hydromorphone and ondansetron.  ABDOMINAL x-rays are suggestive of possible early bowel obstruction. Laboratory workup is significant for elevated transaminases compared with baseline including elevated alkaline phosphatase. Limited bedside ultrasound was attempted but I could not visualize the gallbladder. I reviewed his recent CT scan which showed a well distended gallbladder but without visible calculi. He had received a GI cocktail earlier, which had given him some temporary relief, and that may have caused his gallbladder to contract. Formal abdominal ultrasound has been ordered. His pain level has  increased again and he is given a second dose of hydromorphone.  Dione Booze, MD 09/03/12 [phone]

## 2012-09-01 NOTE — ED Notes (Signed)
Patient transported to CT 

## 2012-09-01 NOTE — ED Notes (Signed)
MD at bedside. 

## 2012-09-02 ENCOUNTER — Inpatient Hospital Stay (HOSPITAL_COMMUNITY): Payer: 59

## 2012-09-02 DIAGNOSIS — K8050 Calculus of bile duct without cholangitis or cholecystitis without obstruction: Secondary | ICD-10-CM

## 2012-09-02 DIAGNOSIS — R932 Abnormal findings on diagnostic imaging of liver and biliary tract: Secondary | ICD-10-CM

## 2012-09-02 DIAGNOSIS — K810 Acute cholecystitis: Secondary | ICD-10-CM

## 2012-09-02 HISTORY — DX: Calculus of bile duct without cholangitis or cholecystitis without obstruction: K80.50

## 2012-09-02 LAB — COMPREHENSIVE METABOLIC PANEL
ALT: 686 U/L — ABNORMAL HIGH (ref 0–53)
AST: 648 U/L — ABNORMAL HIGH (ref 0–37)
Albumin: 3.3 g/dL — ABNORMAL LOW (ref 3.5–5.2)
Alkaline Phosphatase: 320 U/L — ABNORMAL HIGH (ref 39–117)
BUN: 10 mg/dL (ref 6–23)
Chloride: 99 mEq/L (ref 96–112)
Glucose, Bld: 126 mg/dL — ABNORMAL HIGH (ref 70–99)
Potassium: 4 mEq/L (ref 3.5–5.1)
Total Protein: 6.9 g/dL (ref 6.0–8.3)

## 2012-09-02 LAB — CBC
HCT: 43.4 % (ref 39.0–52.0)
MCH: 30.5 pg (ref 26.0–34.0)
MCV: 87.1 fL (ref 78.0–100.0)
RBC: 4.98 MIL/uL (ref 4.22–5.81)
RDW: 12.5 % (ref 11.5–15.5)
WBC: 8.5 10*3/uL (ref 4.0–10.5)

## 2012-09-02 LAB — SURGICAL PCR SCREEN: MRSA, PCR: NEGATIVE

## 2012-09-02 LAB — GLUCOSE, CAPILLARY: Glucose-Capillary: 150 mg/dL — ABNORMAL HIGH (ref 70–99)

## 2012-09-02 MED ORDER — GADOBENATE DIMEGLUMINE 529 MG/ML IV SOLN
19.0000 mL | Freq: Once | INTRAVENOUS | Status: AC | PRN
  Administered 2012-09-02: 19 mL via INTRAVENOUS

## 2012-09-02 NOTE — Progress Notes (Signed)
Patient ID: Darren Ponce, male   DOB: 09/06/1950, 62 y.o.   MRN: [id number]   MRCP shows a probable 4 mm stone  at ampulla.  Pt informed, and scheduled for ERCP in am tomorrow, continue Unasyn.

## 2012-09-02 NOTE — Consult Note (Signed)
Referring Provider: Dr Ezzard Standing Primary Care Physician:  Lolita Patella, MD Primary Gastroenterologist:  none  Reason for Consultation:  Possible CBD stone, acute cholecystitis  HPI: Darren Ponce is a 62 y.o. male , generally in good health with history of early chronic kidney disease. Patient was admitted through the emergency room yesterday after acute onset of severe epigastric pain which persisted for 12 hours prior to coming to the emergency room. Patient had had a similar attack in May of 2014 which was not as severe. He says in between these episodes he had felt fine. Workup in the emergency room with upper abdominal ultrasound showed multiple gallstones with trace pericholecystic fluid and a common bile duct of 7 mm with no stones seen. He also had CT of the abdomen and pelvis which showed hyperemia of the gallbladder wall and associated pericholecystic fluid with mild common bile duct dilation.  HIDA scan done last evening shows non-visualization of the gallbladder or common bile duct . Bilirubin was normal on admission yesterday but has risen to 2.8 today alkaline phosphatase is 320 Patient states he feels fine as long as he takes pain medication but when the pain medication wears off he is still hurting her it he has not had any fever or chills and no vomiting. Surgery has requested preop ERCP   Past Medical History  Diagnosis Date  . Kidney stone   . PONV (postoperative nausea and vomiting)     Past Surgical History  Procedure Laterality Date  . Hernia repair      Prior to Admission medications   Medication Sig Start Date End Date Taking? Authorizing Provider  acetaminophen (TYLENOL) 500 MG tablet Take 1,000 mg by mouth every 6 (six) hours as needed for pain.   Yes Historical Provider, MD    Current Facility-Administered Medications  Medication Dose Route Frequency Provider Last Rate Last Dose  . acetaminophen (TYLENOL) tablet 650 mg  650 mg Oral Q6H PRN Alison Murray, MD       Or  . acetaminophen (TYLENOL) suppository 650 mg  650 mg Rectal Q6H PRN Alison Murray, MD      . Ampicillin-Sulbactam (UNASYN) 3 g in sodium chloride 0.9 % 100 mL IVPB  3 g Intravenous Q8H Alison Murray, MD   3 g at 09/02/12 0936  . dextrose 5 % and 0.45 % NaCl with KCl 20 mEq/L infusion   Intravenous Continuous Kandis Cocking, MD 125 mL/hr at 09/02/12 0315    . hydrALAZINE (APRESOLINE) injection 10 mg  10 mg Intravenous Q6H PRN Roma Kayser Schorr, NP      . HYDROmorphone (DILAUDID) injection 1-2 mg  1-2 mg Intravenous Q2H PRN Alison Murray, MD   1 mg at 09/02/12 0759  . ondansetron (ZOFRAN) tablet 4 mg  4 mg Oral Q6H PRN Alison Murray, MD       Or  . ondansetron [facility]) injection 4 mg  4 mg Intravenous Q6H PRN Alison Murray, MD   4 mg at 09/02/12 1610  . promethazine (PHENERGAN) injection 25 mg  25 mg Intravenous Q6H PRN Kandis Cocking, MD   25 mg at 09/01/12 1823  . sodium chloride 0.9 % injection 3 mL  3 mL Intravenous Q12H Alison Murray, MD   3 mL at 09/01/12 2151    Allergies as of 09/01/2012 - Review Complete 09/01/2012  Allergen Reaction Noted  . Codeine  08/20/2012    History reviewed. No pertinent family history.  History  Social History  . Marital Status: Married    Spouse Name: N/A    Number of Children: N/A  . Years of Education: N/A   Occupational History  . Not on file.   Social History Main Topics  . Smoking status: Never Smoker   . Smokeless tobacco: Never Used  . Alcohol Use: Yes     Comment: occasionally  . Drug Use: No  . Sexually Active: No   Other Topics Concern  . Not on file   Social History Narrative  . No narrative on file    Review of Systems: Pertinent positive and negative review of systems were noted in the above HPI section.  All other review of systems was otherwise negative.  Physical Exam: Vital signs in last 24 hours: Temp:  [97.5 F (36.4 C)-98.2 F (36.8 C)] 97.6 F (36.4 C) (06/12 0600) Pulse Rate:   [69-82] 70 (06/12 0600) Resp:  [18-20] 20 (06/12 0600) BP: (126-206)/(70-102) 153/92 mmHg (06/12 0600) SpO2:  [93 %-98 %] 93 % (06/12 0600) Weight:  [200 lb (90.719 kg)] 200 lb (90.719 kg) (06/11 1154) Last BM Date: 08/29/12 General:   Alert,  Well-developed, well-nourished WM , pleasant and cooperative in NAD Head:  Normocephalic and atraumatic. Eyes:  Sclera clear, no icterus.   Conjunctiva pink. Ears:  Normal auditory acuity. Nose:  No deformity, discharge,  or lesions. Mouth:  No deformity or lesions.   Neck:  Supple; no masses or thyromegaly. Lungs:  Clear throughout to auscultation.   No wheezes, crackles, or rhonchi. Heart:  Regular rate and rhythm; no murmurs, clicks, rubs,  or gallops. Abdomen:  Soft, tender epigastrium and RUQ,, BS active,no palp mass or hsm. No guarding or rebound   Rectal:  Deferred  Msk:  Symmetrical without gross deformities. . Pulses:  Normal pulses noted. Extremities:  Without clubbing or edema. Neurologic:  Alert and  oriented x4;  grossly normal neurologically. Skin:  Intact without significant lesions or rashes.. Psych:  Alert and cooperative. Normal mood and affect.  Intake/Output from previous day:   Intake/Output this shift:    Lab Results:  Recent Labs  09/01/12 0430 09/01/12 1235 09/02/12 0555  WBC 12.5* 10.8* 8.5  HGB 16.1 15.7 15.2  HCT 45.8 43.6 43.4  PLT 181 191 177   BMET  Recent Labs  09/01/12 0430 09/01/12 1235 09/02/12 0555  NA 136 135 137  K 3.9 4.4 4.0  CL 99 98 99  CO2 26 29 31   GLUCOSE 165* 157* 126*  BUN 14 11 10   CREATININE 1.12 1.06 1.12  CALCIUM 10.0 9.3 9.3   LFT  Recent Labs  09/02/12 0555  PROT 6.9  ALBUMIN 3.3*  AST 648*  ALT 686*  ALKPHOS 320*  BILITOT 2.8*   PT/INR  Recent Labs  09/01/12 1235  LABPROT 13.2  INR 1.01    Studies/Results: Nm Hepatobiliary Liver Func  09/01/2012   *RADIOLOGY REPORT*  Clinical Data:  62 year old male with abdominal pain and nausea. Abnormal  gallbladder by CT and ultrasound.  NUCLEAR MEDICINE HEPATOBILIARY IMAGING  Technique:  Sequential images of the abdomen were obtained out to 60 minutes following intravenous administration of radiopharmaceutical.  Radiopharmaceutical:  5.61mCi Tc-26m Choletec  Comparison:  CT abdomen and pelvis and ultrasound 09/01/2012.  Findings: The study was augmented with 3.6 mg IV morphine the following the first 60 minutes of imaging.  Despite this augmentation and 2.5 hours of planar imaging.  No gallbladder or CBD activity occurred.  Incidental bladder excretion of radiotracer (  felt related to free technetium).  IMPRESSION: No gallbladder visualization despite 2.5 hours of imaging and augmentation of the study with IV morphine.  Constellation of imaging findings on this and the comparison studies highly suspicious for acute cholecystitis.  Study discussed by telephone with Dr. Ashok Pall on 09/01/2012 at 1725 hours.   Original Report Authenticated By: Erskine Speed, M.D.   US Abdomen Complete  09/01/2012   *RADIOLOGY REPORT*  Clinical Data:  Abdominal pain, constipation  COMPLETE ABDOMINAL ULTRASOUND  Comparison:  CT abdomen pelvis dated 08/20/2012  Findings:  Gallbladder:  Multiple layering gallstones.  No gallbladder wall thickening.  Trace pericholecystic fluid.  Sonographic Murphy's cannot be assessed in the setting of pain medication.  Common bile duct:  Measures 7 mm.  No choledocholithiasis is seen.  Liver:  Hepatic steatosis.  Focal fatty sparing in the gallbladder fossa.  IVC:  Not visualized due to overlying bowel gas.  Pancreas:  Not visualized due to overlying bowel gas.  Spleen:  Measures 11.6 cm.  Right Kidney:  Measures 10.8 cm.  No mass or hydronephrosis.  Left Kidney:  Measures 11.2 cm.  No mass or hydronephrosis.  Abdominal aorta:  Not visualized due to overlying bowel gas.  IMPRESSION: Multiple gallstones with trace pericholecystic fluid.  No gallbladder wall thickening.  Sonographic Murphy's cannot be  assessed in the setting of pain medication.  This appearance is equivocal.  Early acute cholecystitis is possible.  Consider hepatobiliary nuclear medicine scan for further evaluation as clinically warranted.  Hepatic steatosis with focal fatty sparing.   Original Report Authenticated By: Charline Bills, M.D.   Ct Abdomen Pelvis W Contrast  09/01/2012   *RADIOLOGY REPORT*  Clinical Data: Mid abdominal pain, back pain, nausea  CT ABDOMEN AND PELVIS WITH CONTRAST  Technique:  Multidetector CT imaging of the abdomen and pelvis was performed following the standard protocol during bolus administration of intravenous contrast.  Contrast: OMNIPAQUE IOHEXOL 300 MG/ML  SOLN  Comparison: CT 08/20/2012  Findings: There is mild bibasilar atelectasis.  No infiltrate or pleural fluid.  No pericardial fluid.  There is no focal hepatic lesion.  There is enhancement of the gallbladder wall.  There is small amount of pericholecystic fluid. Gallbladder measures 4 cm in diameter.  The common bile duct is upper limits of normal diameter at pancreatic head measuring 6 mm. There is mild extrahepatic ductal dilatation of the hepatic ducts. No radiodense gallstones present.  There is no peripancreatic inflammation.  The pancreatic duct is normal caliber.  The spleen, adrenal glands, kidneys are normal.  The stomach is normal.  The small bowel, appendix, and cecum are normal.  The colon and rectosigmoid colon normal.  Abdominal aorta normal caliber.  No retroperitoneal periportal lymphadenopathy.  No free fluid the pelvis.  The bladder and prostate gland normal. No pelvic lymphadenopathy.  There is a fat filled of the left inguinal hernia. Review of  bone windows demonstrates no aggressive osseous lesions.  IMPRESSION: 1.  Hyperemia of the gallbladder wall with associated pericholecystic fluid and mild extrahepatic biliary duct dilatation suggests acute cholecystitis.  No radiodense gallstones are identified. 2.  No evidence of  pancreatitis.  The findings discussed with Dr. Manus Gunning at 09/01/2012 at 11:00 a.m.   Original Report Authenticated By: Genevive Bi, M.D.   Dg Abd Acute W/chest  09/01/2012   *RADIOLOGY REPORT*  Clinical Data: Mid abdominal pain off and on for 2 weeks.  Severe abdominal pain this morning.  Nausea.  ACUTE ABDOMEN SERIES (ABDOMEN 2 VIEW &  CHEST 1 VIEW)  Comparison: CT abdomen and pelvis 08/20/2012.  Chest 04/29/2006  Findings: Borderline heart size but with mild pulmonary vascular congestion.  Interstitial changes in the lungs may represent edema. Linear fibrosis or atelectasis in the right lung base.  No pneumothorax.  No blunting of costophrenic angles.  Scattered gas and stool in the colon without distension.  There are a few mildly distended gas filled loops of small bowel in the left abdomen with air-fluid levels.  Changes suggest early or partial obstruction.  No free intra-abdominal air.  No radiopaque stones. Visualized bones appear intact.  IMPRESSION: Mildly prominent gas-filled loops of small bowel in the left upper quadrant with air-fluid levels suggest early obstruction. Pulmonary vascular congestion and interstitial edema seen in the lungs.   Original Report Authenticated By: Burman Nieves, M.D.    IMPRESSION:  #28  62 year old white male with acute cholecystitis, cholelithiasis and possible choledocholithiasis. His elevated liver function studies may be secondary to acute cholecystitis but cannot rule out a common bile duct stone. #2 early chronic kidney disease  PLAN: MRCP today-if positive for common bile duct stones an ERCP can be done tomorrow. If no stones would proceed with lap chole. Laparoscopic cholecystectomy-timing per Dr. Ezzard Standing Patient can have clear liquids today Continue Unasyn Continued Dilaudid  as needed for pain MRCP and ERCP were discussed in detail with the patient and his family and he is agreeable to proceed. Complications of ERCP including 6% risk of  pancreatitis were also discussed   Amy Esterwood  09/02/2012, 10:42 AM   [facility] Attending  I have also seen and assessed the patient and agree with the above note. The risks and benefits as well as alternatives of endoscopic procedure(s) have been discussed and reviewed. All questions answered. The patient agrees to proceed with ERCP if we decide that is needed. I favor this is most likely from cholecystitis but understand surgeons concerns about possible choledocholithiasis.  Iva Boop, MD, [facility] [phone] (pager) 09/02/2012 12:20 PM

## 2012-09-02 NOTE — Progress Notes (Signed)
PT NOTE  Order received. Chart reviewed. Spoke briefly with pt-at this time, he denies need for PT services. Reports mobilizing in room unassisted. Noted in chart and pt states he may have procedure/surgery performed in the near future, during this stay, but he is not sure when.PT will sign off due to pt not having any PT needs at this time. MD please reorder if/when after procedure pt needs PT evaluation and tx. Thanks. Rebeca Alert, PT [phone]

## 2012-09-02 NOTE — Progress Notes (Signed)
General Surgery Note  LOS: 1 day  Room - 1513  Assessment/Plan: 1.  Cholelithiasis, Cholecystitis  On Unasyn.  Hold cholecystectomy until seen by GI.  2.  Increasing LFT's and CBD non vis on HIDA scan - of concern for CBD stone  Consulted Dr. Christella Hartigan for possible ERCP first.  Discussed this with patient and wife.  3.  DVT prophylaxis - On hold for surgery/ERCP 4.  HTN  Subjective:  RUQ pain a little better this AM.  Wife in room with patient.  Objective:   Filed Vitals:   09/02/12 0600  BP: 153/92  Pulse: 70  Temp: 97.6 F (36.4 C)  Resp: 20     Intake/Output from previous day:     Intake/Output this shift:      Physical Exam:   General: WN bearded WM who is alert and oriented.    HEENT: Normal. Pupils equal. .   Lungs: Clear   Abdomen: Sore abdomen, no peritoneal signs.   Lab Results:    Recent Labs  09/01/12 1235 09/02/12 0555  WBC 10.8* 8.5  HGB 15.7 15.2  HCT 43.6 43.4  PLT 191 177    BMET   Recent Labs  09/01/12 1235 09/02/12 0555  NA 135 137  K 4.4 4.0  CL 98 99  CO2 29 31  GLUCOSE 157* 126*  BUN 11 10  CREATININE 1.06 1.12  CALCIUM 9.3 9.3    PT/INR   Recent Labs  09/01/12 1235  LABPROT 13.2  INR 1.01    ABG  No results found for this basename: PHART, PCO2, PO2, HCO3,  in the last 72 hours   Studies/Results:  Nm Hepatobiliary Liver Func  09/01/2012   *RADIOLOGY REPORT*  Clinical Data:  62 year old male with abdominal pain and nausea. Abnormal gallbladder by CT and ultrasound.  NUCLEAR MEDICINE HEPATOBILIARY IMAGING  Technique:  Sequential images of the abdomen were obtained out to 60 minutes following intravenous administration of radiopharmaceutical.  Radiopharmaceutical:  5.16mCi Tc-49m Choletec  Comparison:  CT abdomen and pelvis and ultrasound 09/01/2012.  Findings: The study was augmented with 3.6 mg IV morphine the following the first 60 minutes of imaging.  Despite this augmentation and 2.5 hours of planar imaging.  No  gallbladder or CBD activity occurred.  Incidental bladder excretion of radiotracer (felt related to free technetium).  IMPRESSION: No gallbladder visualization despite 2.5 hours of imaging and augmentation of the study with IV morphine.  Constellation of imaging findings on this and the comparison studies highly suspicious for acute cholecystitis.  Study discussed by telephone with Dr. Ashok Pall on 09/01/2012 at 1725 hours.   Original Report Authenticated By: Erskine Speed, M.D.   US Abdomen Complete  09/01/2012   *RADIOLOGY REPORT*  Clinical Data:  Abdominal pain, constipation  COMPLETE ABDOMINAL ULTRASOUND  Comparison:  CT abdomen pelvis dated 08/20/2012  Findings:  Gallbladder:  Multiple layering gallstones.  No gallbladder wall thickening.  Trace pericholecystic fluid.  Sonographic Murphy's cannot be assessed in the setting of pain medication.  Common bile duct:  Measures 7 mm.  No choledocholithiasis is seen.  Liver:  Hepatic steatosis.  Focal fatty sparing in the gallbladder fossa.  IVC:  Not visualized due to overlying bowel gas.  Pancreas:  Not visualized due to overlying bowel gas.  Spleen:  Measures 11.6 cm.  Right Kidney:  Measures 10.8 cm.  No mass or hydronephrosis.  Left Kidney:  Measures 11.2 cm.  No mass or hydronephrosis.  Abdominal aorta:  Not visualized due to  overlying bowel gas.  IMPRESSION: Multiple gallstones with trace pericholecystic fluid.  No gallbladder wall thickening.  Sonographic Murphy's cannot be assessed in the setting of pain medication.  This appearance is equivocal.  Early acute cholecystitis is possible.  Consider hepatobiliary nuclear medicine scan for further evaluation as clinically warranted.  Hepatic steatosis with focal fatty sparing.   Original Report Authenticated By: Charline Bills, M.D.   Ct Abdomen Pelvis W Contrast  09/01/2012   *RADIOLOGY REPORT*  Clinical Data: Mid abdominal pain, back pain, nausea  CT ABDOMEN AND PELVIS WITH CONTRAST  Technique:   Multidetector CT imaging of the abdomen and pelvis was performed following the standard protocol during bolus administration of intravenous contrast.  Contrast: OMNIPAQUE IOHEXOL 300 MG/ML  SOLN  Comparison: CT 08/20/2012  Findings: There is mild bibasilar atelectasis.  No infiltrate or pleural fluid.  No pericardial fluid.  There is no focal hepatic lesion.  There is enhancement of the gallbladder wall.  There is small amount of pericholecystic fluid. Gallbladder measures 4 cm in diameter.  The common bile duct is upper limits of normal diameter at pancreatic head measuring 6 mm. There is mild extrahepatic ductal dilatation of the hepatic ducts. No radiodense gallstones present.  There is no peripancreatic inflammation.  The pancreatic duct is normal caliber.  The spleen, adrenal glands, kidneys are normal.  The stomach is normal.  The small bowel, appendix, and cecum are normal.  The colon and rectosigmoid colon normal.  Abdominal aorta normal caliber.  No retroperitoneal periportal lymphadenopathy.  No free fluid the pelvis.  The bladder and prostate gland normal. No pelvic lymphadenopathy.  There is a fat filled of the left inguinal hernia. Review of  bone windows demonstrates no aggressive osseous lesions.  IMPRESSION: 1.  Hyperemia of the gallbladder wall with associated pericholecystic fluid and mild extrahepatic biliary duct dilatation suggests acute cholecystitis.  No radiodense gallstones are identified. 2.  No evidence of pancreatitis.  The findings discussed with Dr. Manus Gunning at 09/01/2012 at 11:00 a.m.   Original Report Authenticated By: Genevive Bi, M.D.   Dg Abd Acute W/chest  09/01/2012   *RADIOLOGY REPORT*  Clinical Data: Mid abdominal pain off and on for 2 weeks.  Severe abdominal pain this morning.  Nausea.  ACUTE ABDOMEN SERIES (ABDOMEN 2 VIEW & CHEST 1 VIEW)  Comparison: CT abdomen and pelvis 08/20/2012.  Chest 04/29/2006  Findings: Borderline heart size but with mild pulmonary  vascular congestion.  Interstitial changes in the lungs may represent edema. Linear fibrosis or atelectasis in the right lung base.  No pneumothorax.  No blunting of costophrenic angles.  Scattered gas and stool in the colon without distension.  There are a few mildly distended gas filled loops of small bowel in the left abdomen with air-fluid levels.  Changes suggest early or partial obstruction.  No free intra-abdominal air.  No radiopaque stones. Visualized bones appear intact.  IMPRESSION: Mildly prominent gas-filled loops of small bowel in the left upper quadrant with air-fluid levels suggest early obstruction. Pulmonary vascular congestion and interstitial edema seen in the lungs.   Original Report Authenticated By: Burman Nieves, M.D.     Anti-infectives:   Anti-infectives   Start     Dose/Rate Route Frequency Ordered Stop   09/01/12 1800  Ampicillin-Sulbactam (UNASYN) 3 g in sodium chloride 0.9 % 100 mL IVPB     3 g 100 mL/hr over 60 Minutes Intravenous Every 8 hours 09/01/12 1745        Ovidio Kin, MD, FACS  Pager: [phone],   [facility] Surgery Office: [phone] 09/02/2012

## 2012-09-02 NOTE — Progress Notes (Signed)
I spoke with Dr Ezzard Standing, will transition care to surgery. Continue with PRN hydralazine for HTN. We are available if needed.  Belkys Regalado, Md.

## 2012-09-03 ENCOUNTER — Inpatient Hospital Stay (HOSPITAL_COMMUNITY): Payer: 59

## 2012-09-03 ENCOUNTER — Encounter (HOSPITAL_COMMUNITY): Payer: Self-pay

## 2012-09-03 ENCOUNTER — Encounter (HOSPITAL_COMMUNITY): Admission: EM | Disposition: A | Discharge status: Home / Self Care | Payer: Self-pay | Source: Home / Self Care

## 2012-09-03 DIAGNOSIS — K8050 Calculus of bile duct without cholangitis or cholecystitis without obstruction: Secondary | ICD-10-CM

## 2012-09-03 HISTORY — PX: ERCP: SHX5425

## 2012-09-03 LAB — CBC
Platelets: 141 10*3/uL — ABNORMAL LOW (ref 150–400)
RBC: 5.19 MIL/uL (ref 4.22–5.81)
WBC: 6.1 10*3/uL (ref 4.0–10.5)

## 2012-09-03 LAB — GLUCOSE, CAPILLARY: Glucose-Capillary: 122 mg/dL — ABNORMAL HIGH (ref 70–99)

## 2012-09-03 LAB — HEPATIC FUNCTION PANEL
Albumin: 3.3 g/dL — ABNORMAL LOW (ref 3.5–5.2)
Bilirubin, Direct: 2.4 mg/dL — ABNORMAL HIGH (ref 0.0–0.3)
Indirect Bilirubin: 0.6 mg/dL (ref 0.3–0.9)
Total Bilirubin: 3 mg/dL — ABNORMAL HIGH (ref 0.3–1.2)

## 2012-09-03 SURGERY — ERCP, WITH INTERVENTION IF INDICATED
Anesthesia: Moderate Sedation

## 2012-09-03 MED ORDER — FENTANYL CITRATE 0.05 MG/ML IJ SOLN
INTRAMUSCULAR | Status: DC | PRN
  Administered 2012-09-03 (×4): 25 ug via INTRAVENOUS

## 2012-09-03 MED ORDER — MIDAZOLAM HCL 10 MG/2ML IJ SOLN
INTRAMUSCULAR | Status: AC
  Filled 2012-09-03: qty 4

## 2012-09-03 MED ORDER — GLUCAGON HCL (RDNA) 1 MG IJ SOLR
INTRAMUSCULAR | Status: AC
  Filled 2012-09-03: qty 2

## 2012-09-03 MED ORDER — BUTAMBEN-TETRACAINE-BENZOCAINE 2-2-14 % EX AERO
INHALATION_SPRAY | CUTANEOUS | Status: DC | PRN
  Administered 2012-09-03: 2 via TOPICAL

## 2012-09-03 MED ORDER — DIPHENHYDRAMINE HCL 50 MG/ML IJ SOLN
INTRAMUSCULAR | Status: DC | PRN
  Administered 2012-09-03: 25 mg via INTRAVENOUS

## 2012-09-03 MED ORDER — FENTANYL CITRATE 0.05 MG/ML IJ SOLN
INTRAMUSCULAR | Status: AC
  Filled 2012-09-03: qty 4

## 2012-09-03 MED ORDER — SODIUM CHLORIDE 0.9 % IV SOLN
INTRAVENOUS | Status: DC | PRN
  Administered 2012-09-03: 13:00:00

## 2012-09-03 MED ORDER — MIDAZOLAM HCL 10 MG/2ML IJ SOLN
INTRAMUSCULAR | Status: DC | PRN
  Administered 2012-09-03 (×3): 2 mg via INTRAVENOUS

## 2012-09-03 MED ORDER — DIPHENHYDRAMINE HCL 50 MG/ML IJ SOLN
INTRAMUSCULAR | Status: AC
  Filled 2012-09-03: qty 1

## 2012-09-03 NOTE — Anesthesia Preprocedure Evaluation (Addendum)
Anesthesia Evaluation  Patient identified by MRN, date of birth, ID band Patient awake    Reviewed: Allergy & Precautions, H&P , NPO status , Patient's Chart, lab work & pertinent test results  History of Anesthesia Complications (+) PONV  Airway Mallampati: II TM Distance: >3 FB Neck ROM: full    Dental no notable dental hx. (+) Teeth Intact and Dental Advisory Given   Pulmonary neg pulmonary ROS,  breath sounds clear to auscultation  Pulmonary exam normal       Cardiovascular Exercise Tolerance: Good hypertension, Pt. on medications negative cardio ROS  Rhythm:regular Rate:Normal     Neuro/Psych negative neurological ROS  negative psych ROS   GI/Hepatic negative GI ROS, Neg liver ROS,   Endo/Other  negative endocrine ROS  Renal/GU negative Renal ROS  negative genitourinary   Musculoskeletal   Abdominal   Peds  Hematology negative hematology ROS (+)   Anesthesia Other Findings   Reproductive/Obstetrics negative OB ROS                           Anesthesia Physical Anesthesia Plan  ASA: II  Anesthesia Plan: General   Post-op Pain Management:    Induction: Intravenous  Airway Management Planned: Oral ETT  Additional Equipment:   Intra-op Plan:   Post-operative Plan: Extubation in OR  Informed Consent: I have reviewed the patients History and Physical, chart, labs and discussed the procedure including the risks, benefits and alternatives for the proposed anesthesia with the patient or authorized representative who has indicated his/her understanding and acceptance.   Dental Advisory Given  Plan Discussed with: CRNA and Surgeon  Anesthesia Plan Comments:         Anesthesia Quick Evaluation

## 2012-09-03 NOTE — Progress Notes (Signed)
Patient ID: Darren Ponce, male   DOB: [birth year], 62 y.o.   MRN: [id number] General Surgery Note  LOS: 2 days  Room - 1513  Assessment/Plan: 1.  Cholelithiasis, Cholecystitis, choledocholithiasis   On Unasyn.  Hold cholecystectomy until after ERCP  ?Lap chole tomorrow if labs and ERCP OK  2.  Increasing LFT's and CBD non vis on HIDA scan - of concern for CBD stone  ERCP today  Discussed this with patient and wife.  3.  DVT prophylaxis - On hold for surgery/ERCP 4.  HTN  Subjective:  RUQ pain better but having lots of nausea,  Wife in room with patient.  Objective:   Filed Vitals:   09/03/12 0812  BP: 158/83  Pulse: 69  Temp: 98.1 F (36.7 C)  Resp:      Intake/Output from previous day:  06/12 0701 - 06/13 0700 In: 3350 [I.V.:3050; IV Piggyback:300] Out: -   Intake/Output this shift:      Physical Exam:   General: WN bearded WM who is alert and oriented.    HEENT: Normal. Pupils equal. .   Lungs: Clear   Abdomen: Sore abdomen, no peritoneal signs.   Lab Results:     Recent Labs  09/02/12 0555 09/03/12 0545  WBC 8.5 6.1  HGB 15.2 15.5  HCT 43.4 45.8  PLT 177 141*    BMET    Recent Labs  09/01/12 1235 09/02/12 0555  NA 135 137  K 4.4 4.0  CL 98 99  CO2 29 31  GLUCOSE 157* 126*  BUN 11 10  CREATININE 1.06 1.12  CALCIUM 9.3 9.3    PT/INR    Recent Labs  09/01/12 1235  LABPROT 13.2  INR 1.01    ABG  No results found for this basename: PHART, PCO2, PO2, HCO3,  in the last 72 hours   Studies/Results:  Nm Hepatobiliary Liver Func  09/01/2012   *RADIOLOGY REPORT*  Clinical Data:  62 year old male with abdominal pain and nausea. Abnormal gallbladder by CT and ultrasound.  NUCLEAR MEDICINE HEPATOBILIARY IMAGING  Technique:  Sequential images of the abdomen were obtained out to 60 minutes following intravenous administration of radiopharmaceutical.  Radiopharmaceutical:  5.41mCi Tc-51m Choletec  Comparison:  CT abdomen and pelvis and ultrasound  09/01/2012.  Findings: The study was augmented with 3.6 mg IV morphine the following the first 60 minutes of imaging.  Despite this augmentation and 2.5 hours of planar imaging.  No gallbladder or CBD activity occurred.  Incidental bladder excretion of radiotracer (felt related to free technetium).  IMPRESSION: No gallbladder visualization despite 2.5 hours of imaging and augmentation of the study with IV morphine.  Constellation of imaging findings on this and the comparison studies highly suspicious for acute cholecystitis.  Study discussed by telephone with Dr. Ashok Pall on 09/01/2012 at 1725 hours.   Original Report Authenticated By: Erskine Speed, M.D.   Ct Abdomen Pelvis W Contrast  09/01/2012   *RADIOLOGY REPORT*  Clinical Data: Mid abdominal pain, back pain, nausea  CT ABDOMEN AND PELVIS WITH CONTRAST  Technique:  Multidetector CT imaging of the abdomen and pelvis was performed following the standard protocol during bolus administration of intravenous contrast.  Contrast: OMNIPAQUE IOHEXOL 300 MG/ML  SOLN  Comparison: CT 08/20/2012  Findings: There is mild bibasilar atelectasis.  No infiltrate or pleural fluid.  No pericardial fluid.  There is no focal hepatic lesion.  There is enhancement of the gallbladder wall.  There is small amount of pericholecystic fluid. Gallbladder measures  4 cm in diameter.  The common bile duct is upper limits of normal diameter at pancreatic head measuring 6 mm. There is mild extrahepatic ductal dilatation of the hepatic ducts. No radiodense gallstones present.  There is no peripancreatic inflammation.  The pancreatic duct is normal caliber.  The spleen, adrenal glands, kidneys are normal.  The stomach is normal.  The small bowel, appendix, and cecum are normal.  The colon and rectosigmoid colon normal.  Abdominal aorta normal caliber.  No retroperitoneal periportal lymphadenopathy.  No free fluid the pelvis.  The bladder and prostate gland normal. No pelvic  lymphadenopathy.  There is a fat filled of the left inguinal hernia. Review of  bone windows demonstrates no aggressive osseous lesions.  IMPRESSION: 1.  Hyperemia of the gallbladder wall with associated pericholecystic fluid and mild extrahepatic biliary duct dilatation suggests acute cholecystitis.  No radiodense gallstones are identified. 2.  No evidence of pancreatitis.  The findings discussed with Dr. Manus Gunning at 09/01/2012 at 11:00 a.m.   Original Report Authenticated By: Genevive Bi, M.D.   Mr 3d Recon At Scanner  09/02/2012   *RADIOLOGY REPORT*  Clinical Data:  Abdominal pain, nausea, suspected cholecystitis on CT, evaluate for common duct stones.  MRI ABDOMEN WITHOUT AND WITH CONTRAST (INCLUDING MRCP)  Technique:  Multiplanar multisequence MR imaging of the abdomen was performed both before and after the administration of intravenous contrast. Heavily T2-weighted images of the biliary and pancreatic ducts were obtained, and three-dimensional MRCP images were rendered by post processing.  Contrast: 19mL MULTIHANCE GADOBENATE DIMEGLUMINE 529 MG/ML IV SOLN  Comparison:  CT abdomen pelvis dated 09/01/2012.  Ultrasound abdomen dated 09/01/2012.  Findings:  Mild dependent atelectasis at the lung bases.  Moderate hepatic steatosis with focal fatty sparing in the gallbladder fossa.  No suspicious/enhancing hepatic lesions.  Mild periportal edema.  Spleen, pancreas, and adrenal glands within normal limits.  Numerous gallstones with borderline gallbladder wall thickening and trace pericholecystic fluid, worrisome for early acute cholecystitis.  Less likely, this could be secondary to a right upper quadrant inflammatory process such as hepatitis.  Common duct measures 10 mm, unchanged.  Possible 4 mm distal common duct calculus at the ampulla (series 3/image 40).  6 mm anterior interpolar right renal cyst (series 1302/image 75). Kidneys are otherwise within normal limits.  No hydronephrosis.  No abdominal  ascites.  No suspicious abdominal lymphadenopathy.  No focal osseous lesions.  IMPRESSION: Cholelithiasis with findings worrisome for early acute cholecystitis.  Common duct measures 10 mm, unchanged.  Possible 4 mm common duct calculus at the ampulla.  Moderate hepatic steatosis.   Original Report Authenticated By: Charline Bills, M.D.   Mr Abd W/wo Cm/mrcp  09/02/2012   *RADIOLOGY REPORT*  Clinical Data:  Abdominal pain, nausea, suspected cholecystitis on CT, evaluate for common duct stones.  MRI ABDOMEN WITHOUT AND WITH CONTRAST (INCLUDING MRCP)  Technique:  Multiplanar multisequence MR imaging of the abdomen was performed both before and after the administration of intravenous contrast. Heavily T2-weighted images of the biliary and pancreatic ducts were obtained, and three-dimensional MRCP images were rendered by post processing.  Contrast: 19mL MULTIHANCE GADOBENATE DIMEGLUMINE 529 MG/ML IV SOLN  Comparison:  CT abdomen pelvis dated 09/01/2012.  Ultrasound abdomen dated 09/01/2012.  Findings:  Mild dependent atelectasis at the lung bases.  Moderate hepatic steatosis with focal fatty sparing in the gallbladder fossa.  No suspicious/enhancing hepatic lesions.  Mild periportal edema.  Spleen, pancreas, and adrenal glands within normal limits.  Numerous gallstones with borderline gallbladder wall  thickening and trace pericholecystic fluid, worrisome for early acute cholecystitis.  Less likely, this could be secondary to a right upper quadrant inflammatory process such as hepatitis.  Common duct measures 10 mm, unchanged.  Possible 4 mm distal common duct calculus at the ampulla (series 3/image 40).  6 mm anterior interpolar right renal cyst (series 1302/image 75). Kidneys are otherwise within normal limits.  No hydronephrosis.  No abdominal ascites.  No suspicious abdominal lymphadenopathy.  No focal osseous lesions.  IMPRESSION: Cholelithiasis with findings worrisome for early acute cholecystitis.  Common  duct measures 10 mm, unchanged.  Possible 4 mm common duct calculus at the ampulla.  Moderate hepatic steatosis.   Original Report Authenticated By: Charline Bills, M.D.     Anti-infectives:   Anti-infectives   Start     Dose/Rate Route Frequency Ordered Stop   09/01/12 1800  Ampicillin-Sulbactam (UNASYN) 3 g in sodium chloride 0.9 % 100 mL IVPB     3 g 100 mL/hr over 60 Minutes Intravenous Every 8 hours 09/01/12 1745        WHITE, [facility] Surgery Office: [phone] 09/03/2012  A lot of family in room. Agree with above.  Ovidio Kin, MD, [facility] Surgery Pager: [phone] Office phone:  [phone]

## 2012-09-03 NOTE — Progress Notes (Signed)
Patient ID: NTHONY LEFFERTS, male   DOB: [birth year], 62 y.o.   MRN: [id number] [facility] Progress Note  Subjective: Very nauseated this am, pain not as bad   Objective:  Vital signs in last 24 hours: Temp:  [97.6 F (36.4 C)-98.1 F (36.7 C)] 98.1 F (36.7 C) (06/13 0812) Pulse Rate:  [69-75] 69 (06/13 0812) Resp:  [18] 18 (06/12 2118) BP: (138-172)/(82-106) 158/83 mmHg (06/13 0812) SpO2:  [96 %-98 %] 96 % (06/13 0812) Last BM Date: 09/03/12 General:   Alert,  Well-developed, WM   in NAD,uncomfortable appearing,mild icteris Heart:  Regular rate and rhythm; no murmurs Pulm;clear Abdomen:  Soft, tender upper abd, and nondistended. Normal bowel sounds, without guarding, and without rebound.   Extremities:  Without edema. Neurologic:  Alert and  oriented x4;  grossly normal neurologically. Psych:  Alert and cooperative. Normal mood and affect.  Intake/Output from previous day: 06/12 0701 - 06/13 0700 In: 3350 [I.V.:3050; IV Piggyback:300] Out: -  Intake/Output this shift:    Lab Results:  Recent Labs  09/01/12 1235 09/02/12 0555 09/03/12 0545  WBC 10.8* 8.5 6.1  HGB 15.7 15.2 15.5  HCT 43.6 43.4 45.8  PLT 191 177 141*   BMET  Recent Labs  09/01/12 0430 09/01/12 1235 09/02/12 0555  NA 136 135 137  K 3.9 4.4 4.0  CL 99 98 99  CO2 26 29 31   GLUCOSE 165* 157* 126*  BUN 14 11 10   CREATININE 1.12 1.06 1.12  CALCIUM 10.0 9.3 9.3   LFT  Recent Labs  09/03/12 0545  PROT 7.0  ALBUMIN 3.3*  AST 415*  ALT 685*  ALKPHOS 388*  BILITOT 3.0*  BILIDIR 2.4*  IBILI 0.6   PT/INR  Recent Labs  09/01/12 1235  LABPROT 13.2  INR 1.01     Assessment / Plan: #1  62 yo male with acute cholecystitis, and choledocholithiasis For ERCP this am, then lap chole next 24 hours On Unasyn Questions answered Add phenergan for nausea Principal Problem:   Epigastric pain Active Problems:   Acute cholecystitis   Accelerated hypertension   Leukocytosis  Abnormal LFTs   Nonspecific (abnormal) findings on radiological and other examination of biliary tract     LOS: 2 days   Sonja Manseau  09/03/2012, 9:03 AM

## 2012-09-03 NOTE — Progress Notes (Signed)
Received from Endo, drowsy but arouses , vs within normal limits, assisted to bathroom, voided large amount, gait unsteady but tolerated well, instructed to call if needs to get out of bed again for assistance.  Family in room with patient.

## 2012-09-03 NOTE — Op Note (Signed)
[facility] [address] [place], [address]   ERCP PROCEDURE REPORT  PATIENT: Darren Ponce, Darren Ponce.  MR# :[id number] BIRTHDATE: 12/07/1950  GENDER: Male ENDOSCOPIST: Iva Boop, MD, [facility] REFERRED BY: Ovidio Kin, M.D. PROCEDURE DATE:  09/03/2012 PROCEDURE:   ERCP with sphincterotomy/papillotomy and ERCP with removal of calculus/calculi ASA CLASS:   Class II INDICATIONS:abnormal MRCP.   suspected bile duct stone in a patient with cholecystitis MEDICATIONS: Benadryl 25 mg IV, Fentanyl 100 mcg IV, and Versed 6 mg IV TOPICAL ANESTHETIC: Cetacaine Spray  DESCRIPTION OF PROCEDURE:   After the risks benefits and alternatives of the procedure were thoroughly explained, informed consent was obtained.  The     endoscope was introduced through the mouth  and advanced to the second portion of the duodenum . The esophagus was not seen well but stomach and duodenum were normal. Bile was seen. Short-wire Systems developer used.  1.  The ampulla was located in the second portion of the duodenum. The ampulla appeared normal. 2.  A single stone was seen in the common bile duct.- small filling defect noted after free deep cannulation and contrast injection. 3.  Three cholesterol appearing stones were recovered after biliary sphincterotomy and balloon sweeps - small stones max 4-5 mm. 4.  There was dilation of the common bile duct and common hepatic duct, mild, 10-11 mm. 5. Intrahepatics normal 6. Gallbladder did not fill 7. Occlusion cholangiogram at end- no stones The scope was then completely withdrawn from the patient and the procedure terminated.     COMPLICATIONS: .  There were no complications.  ENDOSCOPIC IMPRESSION: 1.   The ampulla was located in the second portion of the duodenum and normal 2.   Single stone in the common bile duct seen on cholangiography, 3 total removed with sweeps 3.   There was dilation of the common bile duct and common  hepatic duct, mild 4. Stomach and duodenum grossly normal. No pancreatogram attempted. RECOMMENDATIONS: cholecystectomy per Dr. Newman/colleagues      eSigned:  Iva Boop, MD, [facility] 09/03/2012 1:22 PM   cc: The Patient

## 2012-09-04 ENCOUNTER — Inpatient Hospital Stay (HOSPITAL_COMMUNITY): Payer: 59

## 2012-09-04 ENCOUNTER — Encounter (HOSPITAL_COMMUNITY): Admission: EM | Disposition: A | Discharge status: Home / Self Care | Payer: Self-pay | Source: Home / Self Care

## 2012-09-04 ENCOUNTER — Inpatient Hospital Stay (HOSPITAL_COMMUNITY): Payer: 59 | Admitting: Anesthesiology

## 2012-09-04 ENCOUNTER — Encounter (HOSPITAL_COMMUNITY): Payer: Self-pay | Admitting: Anesthesiology

## 2012-09-04 DIAGNOSIS — K8010 Calculus of gallbladder with chronic cholecystitis without obstruction: Secondary | ICD-10-CM

## 2012-09-04 HISTORY — PX: CHOLECYSTECTOMY: SHX55

## 2012-09-04 SURGERY — LAPAROSCOPIC CHOLECYSTECTOMY WITH INTRAOPERATIVE CHOLANGIOGRAM
Anesthesia: General | Wound class: Contaminated

## 2012-09-04 MED ORDER — SODIUM CHLORIDE 0.9 % IV SOLN
INTRAVENOUS | Status: DC | PRN
  Administered 2012-09-04: 07:00:00 via INTRAVENOUS

## 2012-09-04 MED ORDER — LACTATED RINGERS IV SOLN
INTRAVENOUS | Status: DC | PRN
  Administered 2012-09-04: 09:00:00 via INTRAVENOUS

## 2012-09-04 MED ORDER — LACTATED RINGERS IV SOLN: INTRAVENOUS | Status: DC

## 2012-09-04 MED ORDER — ONDANSETRON HCL 4 MG/2ML IJ SOLN
INTRAMUSCULAR | Status: DC | PRN
  Administered 2012-09-04: 4 mg via INTRAVENOUS

## 2012-09-04 MED ORDER — CEPASTAT 14.5 MG MT LOZG
1.0000 | LOZENGE | OROMUCOSAL | Status: DC | PRN
  Filled 2012-09-04: qty 18

## 2012-09-04 MED ORDER — LIDOCAINE HCL (CARDIAC) 20 MG/ML IV SOLN
INTRAVENOUS | Status: DC | PRN
  Administered 2012-09-04: 50 mg via INTRAVENOUS

## 2012-09-04 MED ORDER — LACTATED RINGERS IR SOLN
Status: DC | PRN
  Administered 2012-09-04: 4000 mL

## 2012-09-04 MED ORDER — DEXAMETHASONE SODIUM PHOSPHATE 10 MG/ML IJ SOLN
INTRAMUSCULAR | Status: DC | PRN
  Administered 2012-09-04: 10 mg via INTRAVENOUS

## 2012-09-04 MED ORDER — FENTANYL CITRATE 0.05 MG/ML IJ SOLN
INTRAMUSCULAR | Status: DC | PRN
  Administered 2012-09-04 (×2): 50 ug via INTRAVENOUS
  Administered 2012-09-04: 100 ug via INTRAVENOUS

## 2012-09-04 MED ORDER — IOHEXOL 300 MG/ML  SOLN
INTRAMUSCULAR | Status: DC | PRN
  Administered 2012-09-04: 10 mL

## 2012-09-04 MED ORDER — GLYCOPYRROLATE 0.2 MG/ML IJ SOLN
INTRAMUSCULAR | Status: DC | PRN
  Administered 2012-09-04: 0.6 mg via INTRAVENOUS

## 2012-09-04 MED ORDER — PROPOFOL 10 MG/ML IV BOLUS
INTRAVENOUS | Status: DC | PRN
  Administered 2012-09-04: 200 mg via INTRAVENOUS

## 2012-09-04 MED ORDER — NEOSTIGMINE METHYLSULFATE 1 MG/ML IJ SOLN
INTRAMUSCULAR | Status: DC | PRN
  Administered 2012-09-04: 5 mg via INTRAVENOUS

## 2012-09-04 MED ORDER — KCL IN DEXTROSE-NACL 20-5-0.45 MEQ/L-%-% IV SOLN
INTRAVENOUS | Status: DC
  Administered 2012-09-04: 12:00:00 via INTRAVENOUS
  Filled 2012-09-04 (×4): qty 1000

## 2012-09-04 MED ORDER — PROMETHAZINE HCL 25 MG/ML IJ SOLN
12.5000 mg | INTRAMUSCULAR | Status: AC | PRN
Start: 2012-09-04 — End: 2012-09-04
  Administered 2012-09-04 (×2): 12.5 mg via INTRAVENOUS

## 2012-09-04 MED ORDER — HYDROCODONE-ACETAMINOPHEN 10-325 MG PO TABS: 1.0000 | ORAL_TABLET | ORAL | Status: DC | PRN

## 2012-09-04 MED ORDER — SUCCINYLCHOLINE CHLORIDE 20 MG/ML IJ SOLN
INTRAMUSCULAR | Status: DC | PRN
  Administered 2012-09-04: 100 mg via INTRAVENOUS

## 2012-09-04 MED ORDER — HEPARIN SODIUM (PORCINE) 5000 UNIT/ML IJ SOLN
5000.0000 [IU] | Freq: Three times a day (TID) | INTRAMUSCULAR | Status: DC
  Administered 2012-09-04: 5000 [IU] via SUBCUTANEOUS
  Filled 2012-09-04 (×5): qty 1

## 2012-09-04 MED ORDER — BUPIVACAINE HCL (PF) 0.25 % IJ SOLN
INTRAMUSCULAR | Status: DC | PRN
  Administered 2012-09-04: 30 mL

## 2012-09-04 MED ORDER — HYDROMORPHONE HCL PF 1 MG/ML IJ SOLN: 0.2500 mg | INTRAMUSCULAR | Status: DC | PRN

## 2012-09-04 MED ORDER — MIDAZOLAM HCL 5 MG/5ML IJ SOLN
INTRAMUSCULAR | Status: DC | PRN
  Administered 2012-09-04: 1 mg via INTRAVENOUS

## 2012-09-04 MED ORDER — ROCURONIUM BROMIDE 100 MG/10ML IV SOLN
INTRAVENOUS | Status: DC | PRN
  Administered 2012-09-04: 5 mg via INTRAVENOUS
  Administered 2012-09-04 (×3): 10 mg via INTRAVENOUS
  Administered 2012-09-04: 25 mg via INTRAVENOUS

## 2012-09-04 SURGICAL SUPPLY — 44 items
ADH SKN CLS APL DERMABOND .7 (GAUZE/BANDAGES/DRESSINGS)
APL SKNCLS STERI-STRIP NONHPOA (GAUZE/BANDAGES/DRESSINGS) ×1
APPLIER CLIP ROT 10 11.4 M/L (STAPLE) ×2
APR CLP MED LRG 11.4X10 (STAPLE) ×1
BAG SPEC RTRVL LRG 6X4 10 (ENDOMECHANICALS) ×1
BENZOIN TINCTURE PRP APPL 2/3 (GAUZE/BANDAGES/DRESSINGS) ×2 IMPLANT
CANISTER SUCTION 2500CC (MISCELLANEOUS) ×2 IMPLANT
CHLORAPREP W/TINT 26ML (MISCELLANEOUS) ×2 IMPLANT
CHOLANGIOGRAM CATH TAUT (CATHETERS) ×2 IMPLANT
CLIP APPLIE ROT 10 11.4 M/L (STAPLE) ×1 IMPLANT
CLOTH BEACON ORANGE TIMEOUT ST (SAFETY) ×2 IMPLANT
COVER MAYO STAND STRL (DRAPES) ×2 IMPLANT
DECANTER SPIKE VIAL GLASS SM (MISCELLANEOUS) IMPLANT
DERMABOND ADVANCED (GAUZE/BANDAGES/DRESSINGS)
DERMABOND ADVANCED .7 DNX12 (GAUZE/BANDAGES/DRESSINGS) IMPLANT
DRAPE C-ARM 42X120 X-RAY (DRAPES) ×2 IMPLANT
DRAPE LAPAROSCOPIC ABDOMINAL (DRAPES) ×2 IMPLANT
ELECT REM PT RETURN 9FT ADLT (ELECTROSURGICAL) ×2
ELECTRODE REM PT RTRN 9FT ADLT (ELECTROSURGICAL) ×1 IMPLANT
GLOVE BIOGEL PI IND STRL 7.0 (GLOVE) ×1 IMPLANT
GLOVE BIOGEL PI INDICATOR 7.0 (GLOVE) ×1
GLOVE SURG SIGNA 7.5 PF LTX (GLOVE) ×10 IMPLANT
GOWN STRL NON-REIN LRG LVL3 (GOWN DISPOSABLE) ×2 IMPLANT
GOWN STRL REIN XL XLG (GOWN DISPOSABLE) ×6 IMPLANT
HEMOSTAT SURGICEL 4X8 (HEMOSTASIS) ×2 IMPLANT
IV CATH 14GX2 1/4 (CATHETERS) ×2 IMPLANT
IV SET EXT 30 76VOL 4 MALE LL (IV SETS) ×2 IMPLANT
KIT BASIN OR (CUSTOM PROCEDURE TRAY) ×2 IMPLANT
NS IRRIG 1000ML POUR BTL (IV SOLUTION) IMPLANT
POUCH SPECIMEN RETRIEVAL 10MM (ENDOMECHANICALS) ×2 IMPLANT
SCISSORS LAP 5X35 DISP (ENDOMECHANICALS) ×2 IMPLANT
SET IRRIG TUBING LAPAROSCOPIC (IRRIGATION / IRRIGATOR) ×2 IMPLANT
SOLUTION ANTI FOG 6CC (MISCELLANEOUS) ×2 IMPLANT
STOPCOCK K 69 2C6206 (IV SETS) ×2 IMPLANT
STRIP CLOSURE SKIN 1/4X4 (GAUZE/BANDAGES/DRESSINGS) ×2 IMPLANT
SUT VIC AB 5-0 PS2 18 (SUTURE) ×2 IMPLANT
TOWEL OR 17X26 10 PK STRL BLUE (TOWEL DISPOSABLE) ×2 IMPLANT
TRAY LAP CHOLE (CUSTOM PROCEDURE TRAY) ×2 IMPLANT
TROCAR XCEL BLUNT TIP 100MML (ENDOMECHANICALS) ×2 IMPLANT
TROCAR Z-THREAD FIOS 11X100 BL (TROCAR) ×2 IMPLANT
TROCAR Z-THREAD FIOS 5X100MM (TROCAR) ×6 IMPLANT
TROCAR Z-THREAD SLEEVE 11X100 (TROCAR) IMPLANT
TUBING INSUFFLATION 10FT LAP (TUBING) ×2 IMPLANT
WATER STERILE IRR 1500ML POUR (IV SOLUTION) IMPLANT

## 2012-09-04 NOTE — Progress Notes (Signed)
He was taken to O.R. At 0700 by night shift staff, I witnessed his departure and found him to be awake, alert and in no distress.

## 2012-09-04 NOTE — Op Note (Signed)
09/01/2012 - 09/04/2012  9:50 AM  PATIENT:  Darren Ponce, 62 y.o., male, MRN: [id number]  PREOP DIAGNOSIS:  cholecystitis  POSTOP DIAGNOSIS:   Acute and chronic cholecystitis  PROCEDURE:   Procedure(s): LAPAROSCOPIC CHOLECYSTECTOMY WITH INTRAOPERATIVE CHOLANGIOGRAM  SURGEON:   Ovidio Kin, M.D.  Threasa HeadsSheron Nightingale, M.D.  ANESTHESIA:   general  Anesthesiologist: Gaetano Hawthorne, MD CRNA: Trellis Paganini, CRNA  General  ASA: @asa @  EBL:  minimal  ml  BLOOD ADMINISTERED: none  DRAINS: none   LOCAL MEDICATIONS USED:   30 cc 1/4% marcaine  SPECIMEN:   Gall bladder  COUNTS CORRECT:  YES  INDICATIONS FOR PROCEDURE:  Darren Ponce is a 62 y.o. (DOB: [birth year]) white  male whose primary care physician is Lolita Patella, MD and comes for cholecystectomy.   The indications and risks of the gall bladder surgery were explained to the patient.  The risks include, but are not limited to, infection, bleeding, common bile duct injury and open surgery.  SURGERY:  The patient was taken to room #1 at [facility].  The abdomen was prepped with chloroprep.  The patient was on Unasyn with the next dose at 10 AM.   A time out was held and the surgical checklist run.   An infraumbilical incision was made into the abdominal cavity.  A 12 mm Hasson trocar was inserted into the abdominal cavity through the infraumbilical incision and secured with a 0 Vicryl suture.  Three additional trocars were inserted: a 10 mm trocar in the sub-xiphoid location, a 5 mm trocar in the right mid subcostal area, and a 5 mm trocar in the right lateral subcostal area.   The abdomen was explored and the liver, stomach, and bowel that could be seen were unremarkable.   The gall bladder was chronically and acutely inflamed with the inflammation going down around the cystic duct to the common bile duct.  The gall bladder was grasped, and rotated cephalad.  Disssection was carried down to the gall  bladder/cystic duct junction and the cystic duct isolated.  A clip was placed on the gall bladder side of the cystic duct.   An intra-operative cholangiogram was shot.   The intra-operative cholangiogram was shot using a cut off Taut catheter placed through a 14 gauge angiocath in the RUQ.  The Taut catheter was inserted in the cut cystic duct and secured with an endoclip.  A cholangiogram was shot with 15 cc of 1/2 strength Omnipaque.  Using fluoroscopy, the cholangiogram showed the flow of contrast into the common bile duct, up the hepatic radicals, and into the duodenum.  There was no mass or obstruction.  This was a normal intra-operative cholangiogram.   The Taut catheter was removed.  The cystic duct was tripley endoclipped.  The cystic duct was very small and coming out of the inflammation the engulfed the biliary system.  I thought I had adequate control of the cystic duct.   The cystic artery was doubly endoclipped.  The gall bladder was bluntly and sharpley dissected from the gall bladder bed.  The gall bladder was packed with small cholesterol stones.  Some stones were spilled, but they were retrieved at the end of the gall bladder dissection.   After the gall bladder was removed from the liver, the gall bladder bed and Triangle of [place] were inspected.  The gall bladder bed was bloody.  I used cautery to control the bleeding.  I placed Surgicel in the gall bladder  bed.  After copious irrigation and work on the gall bladder bed, there was no bleeding or bile leak.  The gall bladder was placed in a endocatch bag and delivered through the umbilicus.  The abdomen had been irrigated with 4000 cc saline.   The trocars were then removed.  I infiltrated 30 cc of 1/4% Marcaine into the incisions.  The umbilical port closed with a 0 Vicryl suture and the skin closed with 5-0 vicryl.  The skin was painted with Dermabond.  The patient's sponge and needle count were correct.  The patient was transported to  the RR in good condition.  Ovidio Kin, MD, [facility] Surgery Pager: [phone] Office phone:  [phone]

## 2012-09-04 NOTE — Anesthesia Postprocedure Evaluation (Signed)
  Anesthesia Post-op Note  Patient: Darren Ponce  Procedure(s) Performed: Procedure(s) (LRB): LAPAROSCOPIC CHOLECYSTECTOMY WITH INTRAOPERATIVE CHOLANGIOGRAM (N/A)  Patient Location: PACU  Anesthesia Type: General  Level of Consciousness: awake and alert   Airway and Oxygen Therapy: Patient Spontanous Breathing  Post-op Pain: mild  Post-op Assessment: Post-op Vital signs reviewed, Patient's Cardiovascular Status Stable, Respiratory Function Stable, Patent Airway and No signs of Nausea or vomiting  Last Vitals:  Filed Vitals:   09/04/12 1100  BP: 156/71  Pulse: 76  Temp: 36.3 C  Resp: 21    Post-op Vital Signs: stable   Complications: No apparent anesthesia complications

## 2012-09-04 NOTE — Progress Notes (Signed)
Pharmacy: Brief note  D#4 Unasyn in 62 yo M for cholecystitis.  WBC WNL, Renal function remains WNL, AF. No cultures to follow currently.  Continue unasyn 3 grams IV q8h per surgery.     Pharmacy will sign off, Please re-consult with any further questions or dosing needs.   Thanks!  BorgerdingLoma Messing PharmD Pager #: [phone] 11:57 AM 09/04/2012

## 2012-09-04 NOTE — Transfer of Care (Signed)
Immediate Anesthesia Transfer of Care Note  Patient: Darren Ponce  Procedure(s) Performed: Procedure(s): LAPAROSCOPIC CHOLECYSTECTOMY WITH INTRAOPERATIVE CHOLANGIOGRAM (N/A)  Patient Location: PACU  Anesthesia Type:General  Level of Consciousness: awake, alert  and oriented  Airway & Oxygen Therapy: Patient Spontanous Breathing and Patient connected to face mask oxygen  Post-op Assessment: Report given to PACU RN, Post -op Vital signs reviewed and stable and Patient moving all extremities X 4  Post vital signs: stable  Complications: No apparent anesthesia complications

## 2012-09-04 NOTE — Progress Notes (Signed)
We receive him from PACU at this time.  His skin is normal, warm and dry and he is breathing normally.  He is drowsy, and is comfortable in appearance.  He is able to empty his bladder without difficulty upon arrival to his room.  All abd. Incisions are dry and intact, being covered with skin glue.

## 2012-09-05 LAB — COMPREHENSIVE METABOLIC PANEL
AST: 80 U/L — ABNORMAL HIGH (ref 0–37)
Albumin: 3.2 g/dL — ABNORMAL LOW (ref 3.5–5.2)
BUN: 12 mg/dL (ref 6–23)
Calcium: 9.7 mg/dL (ref 8.4–10.5)
Chloride: 99 mEq/L (ref 96–112)
Creatinine, Ser: 1.27 mg/dL (ref 0.50–1.35)
Total Protein: 6.5 g/dL (ref 6.0–8.3)

## 2012-09-05 LAB — GLUCOSE, CAPILLARY: Glucose-Capillary: 126 mg/dL — ABNORMAL HIGH (ref 70–99)

## 2012-09-05 MED ORDER — ALUM & MAG HYDROXIDE-SIMETH 200-200-20 MG/5ML PO SUSP
30.0000 mL | Freq: Once | ORAL | Status: DC
  Filled 2012-09-05: qty 30

## 2012-09-05 MED ORDER — PANTOPRAZOLE SODIUM 40 MG PO TBEC
40.0000 mg | DELAYED_RELEASE_TABLET | Freq: Two times a day (BID) | ORAL | Status: DC
  Administered 2012-09-05 (×2): 40 mg via ORAL
  Filled 2012-09-05 (×2): qty 1

## 2012-09-05 NOTE — Discharge Summary (Signed)
Physician Discharge Summary  Patient ID:  Darren Ponce  MRN: [id number]  DOB/AGE: 62-Aug-1952 62 y.o.  Admit date: 09/01/2012 Discharge date: 09/05/2012  Discharge Diagnoses:  1.  Acute cholecystitis, cholelithiasis 2.  Choledocholithiasis 3.  Hypertension 4.  Small left inguinal hernia seen on CT scan  Operation: Procedure(s): ERCP - C. Gessner - 09/03/2012  LAPAROSCOPIC CHOLECYSTECTOMY WITH INTRAOPERATIVE CHOLANGIOGRAM - D. Saysha Menta - 09/04/2012  Discharged Condition: good  Hospital Course: Darren Ponce is an 62 y.o. male whose primary care physician is Lolita Patella, MD and who was admitted 09/01/2012 with a chief complaint of abdominal pain.  He was found to have gallstones, evidence of cholecystitis, but had elevated LFT's and a HIDA scan that did not show any of the biliary tree.  Dr. Leone Payor was consulted.  He obtained a MRCP which suggested Darren Ponce had common bile duct stones. He underwent an ERCP on Friday, 6/13, by Dr. Leone Payor and was found to have 3 stones.     Then he was brought to the operating room on 09/04/2012 and underwent  LAPAROSCOPIC CHOLECYSTECTOMY WITH INTRAOPERATIVE CHOLANGIOGRAM .   He is now one day post op.  His LFT's have improved and he is ready to go home.  His wife is in the room.   The discharge instructions were reviewed with the patient.  Consults: GI - Dr. Leone Payor  Significant Diagnostic Studies: Results for orders placed during the hospital encounter of 09/01/12  SURGICAL PCR SCREEN      Result Value Range   MRSA, PCR NEGATIVE  NEGATIVE   Staphylococcus aureus NEGATIVE  NEGATIVE  CBC WITH DIFFERENTIAL      Result Value Range   WBC 12.5 (*) 4.0 - 10.5 K/uL   RBC 5.31  4.22 - 5.81 MIL/uL   Hemoglobin 16.1  13.0 - 17.0 g/dL   HCT 40.9  81.1 - 91.4 %   MCV 86.3  78.0 - 100.0 fL   MCH 30.3  26.0 - 34.0 pg   MCHC 35.2  30.0 - 36.0 g/dL   RDW 78.2  95.6 - 21.3 %   Platelets 181  150 - 400 K/uL   Neutrophils Relative % 81 (*) 43 - 77 %    Neutro Abs 10.1 (*) 1.7 - 7.7 K/uL   Lymphocytes Relative 11 (*) 12 - 46 %   Lymphs Abs 1.4  0.7 - 4.0 K/uL   Monocytes Relative 7  3 - 12 %   Monocytes Absolute 0.8  0.1 - 1.0 K/uL   Eosinophils Relative 1  0 - 5 %   Eosinophils Absolute 0.2  0.0 - 0.7 K/uL   Basophils Relative 0  0 - 1 %   Basophils Absolute 0.0  0.0 - 0.1 K/uL  COMPREHENSIVE METABOLIC PANEL      Result Value Range   Sodium 136  135 - 145 mEq/L   Potassium 3.9  3.5 - 5.1 mEq/L   Chloride 99  96 - 112 mEq/L   CO2 26  19 - 32 mEq/L   Glucose, Bld 165 (*) 70 - 99 mg/dL   BUN 14  6 - 23 mg/dL   Creatinine, Ser 0.86  0.50 - 1.35 mg/dL   Calcium 57.8  8.4 - 46.9 mg/dL   Total Protein 7.8  6.0 - 8.3 g/dL   Albumin 4.1  3.5 - 5.2 g/dL   AST 629 (*) 0 - 37 U/L   ALT 91 (*) 0 - 53 U/L   Alkaline Phosphatase 132 (*) 39 -  117 U/L   Total Bilirubin 0.4  0.3 - 1.2 mg/dL   GFR calc non Af Amer 69 (*) >90 mL/min   GFR calc Af Amer 80 (*) >90 mL/min  LIPASE, BLOOD      Result Value Range   Lipase 23  11 - 59 U/L  URINALYSIS, ROUTINE W REFLEX MICROSCOPIC      Result Value Range   Color, Urine YELLOW  YELLOW   APPearance CLEAR  CLEAR   Specific Gravity, Urine 1.021  1.005 - 1.030   pH 5.0  5.0 - 8.0   Glucose, UA NEGATIVE  NEGATIVE mg/dL   Hgb urine dipstick NEGATIVE  NEGATIVE   Bilirubin Urine NEGATIVE  NEGATIVE   Ketones, ur NEGATIVE  NEGATIVE mg/dL   Protein, ur NEGATIVE  NEGATIVE mg/dL   Urobilinogen, UA 0.2  0.0 - 1.0 mg/dL   Nitrite NEGATIVE  NEGATIVE   Leukocytes, UA NEGATIVE  NEGATIVE  ACETAMINOPHEN LEVEL      Result Value Range   Acetaminophen (Tylenol), Serum <15.0  10 - 30 ug/mL  TROPONIN I      Result Value Range   Troponin I <0.30  <0.30 ng/mL  LACTIC ACID, PLASMA      Result Value Range   Lactic Acid, Venous 0.6  0.5 - 2.2 mmol/L  COMPREHENSIVE METABOLIC PANEL      Result Value Range   Sodium 135  135 - 145 mEq/L   Potassium 4.4  3.5 - 5.1 mEq/L   Chloride 98  96 - 112 mEq/L   CO2 29  19 - 32  mEq/L   Glucose, Bld 157 (*) 70 - 99 mg/dL   BUN 11  6 - 23 mg/dL   Creatinine, Ser 6.21  0.50 - 1.35 mg/dL   Calcium 9.3  8.4 - 30.8 mg/dL   Total Protein 7.3  6.0 - 8.3 g/dL   Albumin 3.8  3.5 - 5.2 g/dL   AST 657 (*) 0 - 37 U/L   ALT 109 (*) 0 - 53 U/L   Alkaline Phosphatase 130 (*) 39 - 117 U/L   Total Bilirubin 0.4  0.3 - 1.2 mg/dL   GFR calc non Af Amer 74 (*) >90 mL/min   GFR calc Af Amer 86 (*) >90 mL/min  MAGNESIUM      Result Value Range   Magnesium 1.8  1.5 - 2.5 mg/dL  PHOSPHORUS      Result Value Range   Phosphorus 2.6  2.3 - 4.6 mg/dL  CBC WITH DIFFERENTIAL      Result Value Range   WBC 10.8 (*) 4.0 - 10.5 K/uL   RBC 5.03  4.22 - 5.81 MIL/uL   Hemoglobin 15.7  13.0 - 17.0 g/dL   HCT 84.6  96.2 - 95.2 %   MCV 86.7  78.0 - 100.0 fL   MCH 31.2  26.0 - 34.0 pg   MCHC 36.0  30.0 - 36.0 g/dL   RDW 84.1  32.4 - 40.1 %   Platelets 191  150 - 400 K/uL   Neutrophils Relative % 88 (*) 43 - 77 %   Neutro Abs 9.5 (*) 1.7 - 7.7 K/uL   Lymphocytes Relative 6 (*) 12 - 46 %   Lymphs Abs 0.6 (*) 0.7 - 4.0 K/uL   Monocytes Relative 6  3 - 12 %   Monocytes Absolute 0.7  0.1 - 1.0 K/uL   Eosinophils Relative 0  0 - 5 %   Eosinophils Absolute 0.0  0.0 - 0.7  K/uL   Basophils Relative 0  0 - 1 %   Basophils Absolute 0.0  0.0 - 0.1 K/uL  APTT      Result Value Range   aPTT 27  24 - 37 seconds  PROTIME-INR      Result Value Range   Prothrombin Time 13.2  11.6 - 15.2 seconds   INR 1.01  0.00 - 1.49  COMPREHENSIVE METABOLIC PANEL      Result Value Range   Sodium 137  135 - 145 mEq/L   Potassium 4.0  3.5 - 5.1 mEq/L   Chloride 99  96 - 112 mEq/L   CO2 31  19 - 32 mEq/L   Glucose, Bld 126 (*) 70 - 99 mg/dL   BUN 10  6 - 23 mg/dL   Creatinine, Ser 7.84  0.50 - 1.35 mg/dL   Calcium 9.3  8.4 - 69.6 mg/dL   Total Protein 6.9  6.0 - 8.3 g/dL   Albumin 3.3 (*) 3.5 - 5.2 g/dL   AST 295 (*) 0 - 37 U/L   ALT 686 (*) 0 - 53 U/L   Alkaline Phosphatase 320 (*) 39 - 117 U/L   Total  Bilirubin 2.8 (*) 0.3 - 1.2 mg/dL   GFR calc non Af Amer 69 (*) >90 mL/min   GFR calc Af Amer 80 (*) >90 mL/min  CBC      Result Value Range   WBC 8.5  4.0 - 10.5 K/uL   RBC 4.98  4.22 - 5.81 MIL/uL   Hemoglobin 15.2  13.0 - 17.0 g/dL   HCT 28.4  13.2 - 44.0 %   MCV 87.1  78.0 - 100.0 fL   MCH 30.5  26.0 - 34.0 pg   MCHC 35.0  30.0 - 36.0 g/dL   RDW 10.2  72.5 - 36.6 %   Platelets 177  150 - 400 K/uL  GLUCOSE, CAPILLARY      Result Value Range   Glucose-Capillary 150 (*) 70 - 99 mg/dL  HEPATIC FUNCTION PANEL      Result Value Range   Total Protein 7.0  6.0 - 8.3 g/dL   Albumin 3.3 (*) 3.5 - 5.2 g/dL   AST 440 (*) 0 - 37 U/L   ALT 685 (*) 0 - 53 U/L   Alkaline Phosphatase 388 (*) 39 - 117 U/L   Total Bilirubin 3.0 (*) 0.3 - 1.2 mg/dL   Bilirubin, Direct 2.4 (*) 0.0 - 0.3 mg/dL   Indirect Bilirubin 0.6  0.3 - 0.9 mg/dL  CBC      Result Value Range   WBC 6.1  4.0 - 10.5 K/uL   RBC 5.19  4.22 - 5.81 MIL/uL   Hemoglobin 15.5  13.0 - 17.0 g/dL   HCT 34.7  42.5 - 95.6 %   MCV 88.2  78.0 - 100.0 fL   MCH 29.9  26.0 - 34.0 pg   MCHC 33.8  30.0 - 36.0 g/dL   RDW 38.7  56.4 - 33.2 %   Platelets 141 (*) 150 - 400 K/uL  GLUCOSE, CAPILLARY      Result Value Range   Glucose-Capillary 122 (*) 70 - 99 mg/dL  COMPREHENSIVE METABOLIC PANEL      Result Value Range   Sodium 135  135 - 145 mEq/L   Potassium 4.9  3.5 - 5.1 mEq/L   Chloride 99  96 - 112 mEq/L   CO2 29  19 - 32 mEq/L   Glucose, Bld 132 (*) 70 -  99 mg/dL   BUN 12  6 - 23 mg/dL   Creatinine, Ser 1.61  0.50 - 1.35 mg/dL   Calcium 9.7  8.4 - 09.6 mg/dL   Total Protein 6.5  6.0 - 8.3 g/dL   Albumin 3.2 (*) 3.5 - 5.2 g/dL   AST 80 (*) 0 - 37 U/L   ALT 317 (*) 0 - 53 U/L   Alkaline Phosphatase 308 (*) 39 - 117 U/L   Total Bilirubin 0.8  0.3 - 1.2 mg/dL   GFR calc non Af Amer 59 (*) >90 mL/min   GFR calc Af Amer 69 (*) >90 mL/min  GLUCOSE, CAPILLARY      Result Value Range   Glucose-Capillary 126 (*) 70 - 99 mg/dL    Comment 1 Documented in Chart     Comment 2 Notify RN      Ct Abdomen Pelvis Wo Contrast  08/20/2012   *RADIOLOGY REPORT*  Clinical Data: Chronic back pain, right upper quadrant abdominal pain, history of renal calculi, vomiting  CT ABDOMEN AND PELVIS WITHOUT CONTRAST  Technique:  Multidetector CT imaging of the abdomen and pelvis was performed following the standard protocol without intravenous contrast.  Comparison: 03/04/2010  Findings: Minor dependent basilar atelectasis versus scarring.  No lower lobe pneumonia.  Normal heart size.  No pericardial or pleural effusion.  Abdomen:  Kidneys demonstrate no acute obstruction, hydronephrosis, perinephric inflammatory process, or ureteral calculus on either side.  Ureters are symmetric and decompressed.  Hypoattenuation of the liver parenchyma compatible with hepatic steatosis.  No biliary dilatation.  The gallbladder, biliary system, pancreas, spleen, accessory splenule, and adrenal glands are within normal limits for a noncontrast study and demonstrate no acute process.  Negative for bowel obstruction, dilatation, ileus, or free air. Normal appendix.  No abdominal free fluid, fluid collection, hemorrhage, abscess, or adenopathy.  Pelvis:  No pelvic free fluid, fluid collection, hemorrhage, abscess, or adenopathy.  Small fat containing left inguinal hernia as before.  Vascular calcifications noted.  Diffuse degenerative changes of the spine, pelvis and hips.  IMPRESSION: No acute obstructing urinary tract calculus or hydronephrosis.  Hepatic steatosis or fatty infiltration of the liver.  Normal appendix.  Small fat containing left inguinal hernia.   Original Report Authenticated By: Judie Petit. Miles Costain, M.D.   Dg Cholangiogram Operative  09/04/2012   *RADIOLOGY REPORT*  Clinical Data: Intraoperative cholangiogram  INTRAOPERATIVE CHOLANGIOGRAM  Technique:  Multiple fluoroscopic spot radiographs were obtained during intraoperative cholangiogram and are submitted for  interpretation post-operatively.  Comparison: ERCP 09/03/2012  Findings: Intraoperative images demonstrate cannulation of the cystic duct remnant with antegrade opacification of the common bile duct and retrograde opacification of the common hepatic duct and intrahepatic ducts.  There is mild biliary ductal dilatation. Contrast material flows freely through the ampulla they are and into the duodenum.  No definite filling defect to suggest choledocholithiasis.  IMPRESSION: Intraoperative cholangiogram as above.   Original Report Authenticated By: Malachy Moan, M.D.   Nm Hepatobiliary Liver Func  09/01/2012   *RADIOLOGY REPORT*  Clinical Data:  62 year old male with abdominal pain and nausea. Abnormal gallbladder by CT and ultrasound.  NUCLEAR MEDICINE HEPATOBILIARY IMAGING  Technique:  Sequential images of the abdomen were obtained out to 60 minutes following intravenous administration of radiopharmaceutical.  Radiopharmaceutical:  5.92mCi Tc-13m Choletec  Comparison:  CT abdomen and pelvis and ultrasound 09/01/2012.  Findings: The study was augmented with 3.6 mg IV morphine the following the first 60 minutes of imaging.  Despite this augmentation and 2.5 hours of  planar imaging.  No gallbladder or CBD activity occurred.  Incidental bladder excretion of radiotracer (felt related to free technetium).  IMPRESSION: No gallbladder visualization despite 2.5 hours of imaging and augmentation of the study with IV morphine.  Constellation of imaging findings on this and the comparison studies highly suspicious for acute cholecystitis.  Study discussed by telephone with Dr. Ashok Pall on 09/01/2012 at 1725 hours.   Original Report Authenticated By: Erskine Speed, M.D.   US Abdomen Complete  09/01/2012   *RADIOLOGY REPORT*  Clinical Data:  Abdominal pain, constipation  COMPLETE ABDOMINAL ULTRASOUND  Comparison:  CT abdomen pelvis dated 08/20/2012  Findings:  Gallbladder:  Multiple layering gallstones.  No gallbladder  wall thickening.  Trace pericholecystic fluid.  Sonographic Murphy's cannot be assessed in the setting of pain medication.  Common bile duct:  Measures 7 mm.  No choledocholithiasis is seen.  Liver:  Hepatic steatosis.  Focal fatty sparing in the gallbladder fossa.  IVC:  Not visualized due to overlying bowel gas.  Pancreas:  Not visualized due to overlying bowel gas.  Spleen:  Measures 11.6 cm.  Right Kidney:  Measures 10.8 cm.  No mass or hydronephrosis.  Left Kidney:  Measures 11.2 cm.  No mass or hydronephrosis.  Abdominal aorta:  Not visualized due to overlying bowel gas.  IMPRESSION: Multiple gallstones with trace pericholecystic fluid.  No gallbladder wall thickening.  Sonographic Murphy's cannot be assessed in the setting of pain medication.  This appearance is equivocal.  Early acute cholecystitis is possible.  Consider hepatobiliary nuclear medicine scan for further evaluation as clinically warranted.  Hepatic steatosis with focal fatty sparing.   Original Report Authenticated By: Charline Bills, M.D.   Ct Abdomen Pelvis W Contrast  09/01/2012   *RADIOLOGY REPORT*  Clinical Data: Mid abdominal pain, back pain, nausea  CT ABDOMEN AND PELVIS WITH CONTRAST  Technique:  Multidetector CT imaging of the abdomen and pelvis was performed following the standard protocol during bolus administration of intravenous contrast.  Contrast: OMNIPAQUE IOHEXOL 300 MG/ML  SOLN  Comparison: CT 08/20/2012  Findings: There is mild bibasilar atelectasis.  No infiltrate or pleural fluid.  No pericardial fluid.  There is no focal hepatic lesion.  There is enhancement of the gallbladder wall.  There is small amount of pericholecystic fluid. Gallbladder measures 4 cm in diameter.  The common bile duct is upper limits of normal diameter at pancreatic head measuring 6 mm. There is mild extrahepatic ductal dilatation of the hepatic ducts. No radiodense gallstones present.  There is no peripancreatic inflammation.  The  pancreatic duct is normal caliber.  The spleen, adrenal glands, kidneys are normal.  The stomach is normal.  The small bowel, appendix, and cecum are normal.  The colon and rectosigmoid colon normal.  Abdominal aorta normal caliber.  No retroperitoneal periportal lymphadenopathy.  No free fluid the pelvis.  The bladder and prostate gland normal. No pelvic lymphadenopathy.  There is a fat filled of the left inguinal hernia. Review of  bone windows demonstrates no aggressive osseous lesions.  IMPRESSION: 1.  Hyperemia of the gallbladder wall with associated pericholecystic fluid and mild extrahepatic biliary duct dilatation suggests acute cholecystitis.  No radiodense gallstones are identified. 2.  No evidence of pancreatitis.  The findings discussed with Dr. Manus Gunning at 09/01/2012 at 11:00 a.m.   Original Report Authenticated By: Genevive Bi, M.D.   Mr 3d Recon At Scanner  09/02/2012   *RADIOLOGY REPORT*  Clinical Data:  Abdominal pain, nausea, suspected cholecystitis on CT,  evaluate for common duct stones.  MRI ABDOMEN WITHOUT AND WITH CONTRAST (INCLUDING MRCP)  Technique:  Multiplanar multisequence MR imaging of the abdomen was performed both before and after the administration of intravenous contrast. Heavily T2-weighted images of the biliary and pancreatic ducts were obtained, and three-dimensional MRCP images were rendered by post processing.  Contrast: 19mL MULTIHANCE GADOBENATE DIMEGLUMINE 529 MG/ML IV SOLN  Comparison:  CT abdomen pelvis dated 09/01/2012.  Ultrasound abdomen dated 09/01/2012.  Findings:  Mild dependent atelectasis at the lung bases.  Moderate hepatic steatosis with focal fatty sparing in the gallbladder fossa.  No suspicious/enhancing hepatic lesions.  Mild periportal edema.  Spleen, pancreas, and adrenal glands within normal limits.  Numerous gallstones with borderline gallbladder wall thickening and trace pericholecystic fluid, worrisome for early acute cholecystitis.  Less likely,  this could be secondary to a right upper quadrant inflammatory process such as hepatitis.  Common duct measures 10 mm, unchanged.  Possible 4 mm distal common duct calculus at the ampulla (series 3/image 40).  6 mm anterior interpolar right renal cyst (series 1302/image 75). Kidneys are otherwise within normal limits.  No hydronephrosis.  No abdominal ascites.  No suspicious abdominal lymphadenopathy.  No focal osseous lesions.  IMPRESSION: Cholelithiasis with findings worrisome for early acute cholecystitis.  Common duct measures 10 mm, unchanged.  Possible 4 mm common duct calculus at the ampulla.  Moderate hepatic steatosis.   Original Report Authenticated By: Charline Bills, M.D.   Dg Ercp With Sphincterotomy  09/03/2012   *RADIOLOGY REPORT*  Clinical Data: Common bile duct stone.  ERCP  Technique:  C-arm fluoroscopic images were obtained intraoperatively and submitted for postoperative interpretation. Please see the performing provider's procedural report for the fluoroscopy time utilized.  Comparison: MRI 09/02/2012  Findings: The common bile duct was cannulated and opacified. Opacification of the extrahepatic and intrahepatic biliary system. No large filling defect is identified on these images.  A balloon was used for stone removal.  IMPRESSION: Balloon sweep for stone removal.  Please see the procedure report.   Original Report Authenticated By: Richarda Overlie, M.D.   Dg Abd Acute W/chest  09/01/2012   *RADIOLOGY REPORT*  Clinical Data: Mid abdominal pain off and on for 2 weeks.  Severe abdominal pain this morning.  Nausea.  ACUTE ABDOMEN SERIES (ABDOMEN 2 VIEW & CHEST 1 VIEW)  Comparison: CT abdomen and pelvis 08/20/2012.  Chest 04/29/2006  Findings: Borderline heart size but with mild pulmonary vascular congestion.  Interstitial changes in the lungs may represent edema. Linear fibrosis or atelectasis in the right lung base.  No pneumothorax.  No blunting of costophrenic angles.  Scattered gas and stool  in the colon without distension.  There are a few mildly distended gas filled loops of small bowel in the left abdomen with air-fluid levels.  Changes suggest early or partial obstruction.  No free intra-abdominal air.  No radiopaque stones. Visualized bones appear intact.  IMPRESSION: Mildly prominent gas-filled loops of small bowel in the left upper quadrant with air-fluid levels suggest early obstruction. Pulmonary vascular congestion and interstitial edema seen in the lungs.   Original Report Authenticated By: Burman Nieves, M.D.   Mr Abd W/wo Cm/mrcp  09/02/2012   *RADIOLOGY REPORT*  Clinical Data:  Abdominal pain, nausea, suspected cholecystitis on CT, evaluate for common duct stones.  MRI ABDOMEN WITHOUT AND WITH CONTRAST (INCLUDING MRCP)  Technique:  Multiplanar multisequence MR imaging of the abdomen was performed both before and after the administration of intravenous contrast. Heavily T2-weighted images of the  biliary and pancreatic ducts were obtained, and three-dimensional MRCP images were rendered by post processing.  Contrast: 19mL MULTIHANCE GADOBENATE DIMEGLUMINE 529 MG/ML IV SOLN  Comparison:  CT abdomen pelvis dated 09/01/2012.  Ultrasound abdomen dated 09/01/2012.  Findings:  Mild dependent atelectasis at the lung bases.  Moderate hepatic steatosis with focal fatty sparing in the gallbladder fossa.  No suspicious/enhancing hepatic lesions.  Mild periportal edema.  Spleen, pancreas, and adrenal glands within normal limits.  Numerous gallstones with borderline gallbladder wall thickening and trace pericholecystic fluid, worrisome for early acute cholecystitis.  Less likely, this could be secondary to a right upper quadrant inflammatory process such as hepatitis.  Common duct measures 10 mm, unchanged.  Possible 4 mm distal common duct calculus at the ampulla (series 3/image 40).  6 mm anterior interpolar right renal cyst (series 1302/image 75). Kidneys are otherwise within normal limits.  No  hydronephrosis.  No abdominal ascites.  No suspicious abdominal lymphadenopathy.  No focal osseous lesions.  IMPRESSION: Cholelithiasis with findings worrisome for early acute cholecystitis.  Common duct measures 10 mm, unchanged.  Possible 4 mm common duct calculus at the ampulla.  Moderate hepatic steatosis.   Original Report Authenticated By: Charline Bills, M.D.    Discharge Exam:  Filed Vitals:   09/05/12 0626  BP: 136/113  Pulse: 79  Temp: 98.1 F (36.7 C)  Resp: 18    General: WN WM who is alert and generally healthy appearing.  Lungs: Clear to auscultation and symmetric breath sounds. Heart:  RRR. No murmur or rub. Abdomen: Soft. No mass. Incisions look good.  Discharge Medications:     Medication List    ASK your doctor about these medications       acetaminophen 500 MG tablet  Commonly known as:  TYLENOL  Take 1,000 mg by mouth every 6 (six) hours as needed for pain.       Disposition: 01-Home or Self Care   Activity:  Driving - May drive in 2 or 3 days, if doing well.   Lifting - No lifting > 15 pounds for one week, then no limit.  Wound Care:   May shower starting tomorrow.  Wash with soap and water.  Diet:  Eat light/low fat diet for first week, then no limit.  Follow up appointment:  Call Dr. Allene Pyo office [facility]) at [phone] for an appointment in 2 to 3 weeks.  Medications and dosages:  Resume your home medications.  You have a prescription for:  Has vicodin at home.     Signed: Ovidio Kin, M.D., FACS  09/05/2012, 9:00 AM

## 2012-09-06 ENCOUNTER — Encounter (HOSPITAL_COMMUNITY): Payer: Self-pay | Admitting: Surgery

## 2012-09-22 ENCOUNTER — Ambulatory Visit (INDEPENDENT_AMBULATORY_CARE_PROVIDER_SITE_OTHER): Payer: 59 | Admitting: Surgery

## 2012-09-22 ENCOUNTER — Encounter (INDEPENDENT_AMBULATORY_CARE_PROVIDER_SITE_OTHER): Payer: Self-pay | Admitting: Surgery

## 2012-09-22 VITALS — BP 130/70 | HR 78 | Temp 98.0°F | Resp 16 | Ht 69.5 in | Wt 196.0 lb

## 2012-09-22 DIAGNOSIS — K810 Acute cholecystitis: Secondary | ICD-10-CM

## 2012-09-22 NOTE — Progress Notes (Signed)
[facility]  Darren Kin, MD,  FACS [address] [place]  [address] [place], [place]    [address] Phone:  [phone] FAX:  [phone]   Re:   Darren Ponce DOB:   1951-01-12 MRN:   [id number]  EPIC WAS ACTING UP WHILE PATIENT WAS HERE SO I COULD NOT CHART WHILE HE WAS HERE.  ASSESSMENT AND PLAN: 1. Cholelithiasis, Cholecystitis, choledocholithiasis   Lap chole with IOC - D. Ezzard Standing - 09/04/2012  He looks good. Has no complaints.  His return is PRN.  2. Increasing LFT's and CBD non vis on HIDA scan  ERCP - C. Gessner - 09/03/2012 3. DVT prophylaxis - On hold for surgery/ERCP  4. HTN  HISTORY OF PRESENT ILLNESS: Chief Complaint  Patient presents with  . Routine Post Op    p/o lap chole    Darren Ponce is a 62 y.o. (DOB: [birth year])  white  male who is a patient of READE,ROBERT ALEXANDER, MD and comes to me today for follow up of lap chole.  He comes by himself. He has done very well.  His symptoms have resolved.  He is eating well.  PHYSICAL EXAM: BP 130/70  Pulse 78  Temp(Src) 98 F (36.7 C) (Temporal)  Resp 16  Ht 5' 9.5" (1.765 m)  Wt 196 lb (88.905 kg)  BMI 28.54 kg/m2  Abdomen:  All the incisions look good.  BS present.    DATA REVIEWED: Pathology to patient.  Darren Kin, MD, FACS Office:  [phone]

## 2014-02-08 IMAGING — CT CT ABD-PELV W/ CM
1 of 2 series · 15 of 32 positions shown, 19 images · IV contrast (OMNIPAQUE)
Comparison: CT 08/20/2012

CLINICAL DATA: Mid abdominal pain, back pain, nausea

CT ABDOMEN AND PELVIS WITH CONTRAST
TECHNIQUE: Multidetector CT imaging of the abdomen and pelvis was
performed following the standard protocol during bolus
administration of intravenous contrast.
Contrast: 100mL OMNIPAQUE IOHEXOL 300 MG/ML  SOLN

[Series 2: rtn ap with st · axial · 0.74mm/px · z∈[+842,+1307]mm · 15 of 103 slices shown, 19 images]
[im 5/103  soft-tissue]
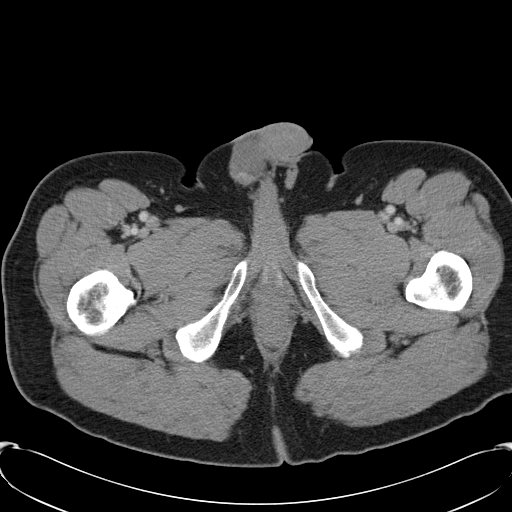
[im 5/103  bone]
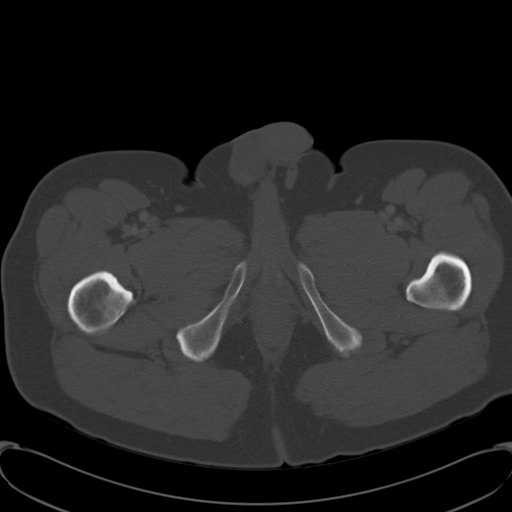
[im 13/103  soft-tissue]
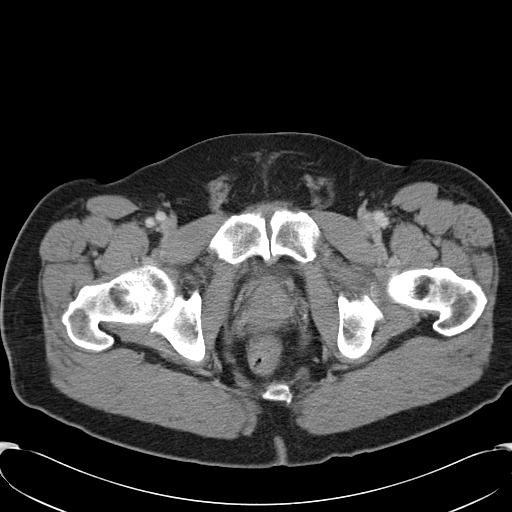
[im 22/103  soft-tissue]
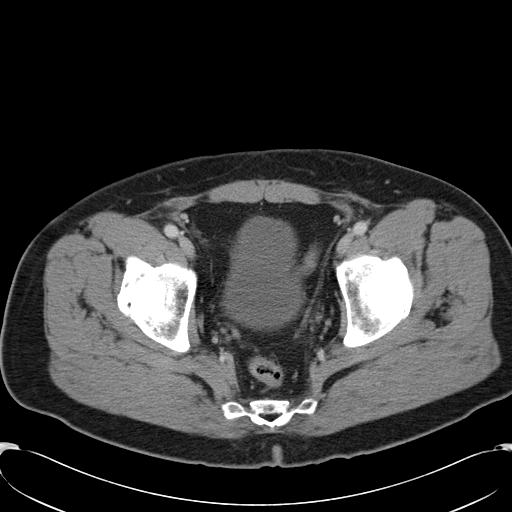
[im 30/103  soft-tissue]
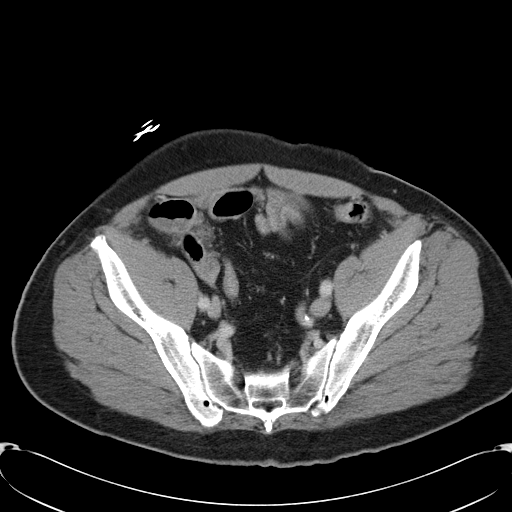
[im 35/103  soft-tissue]
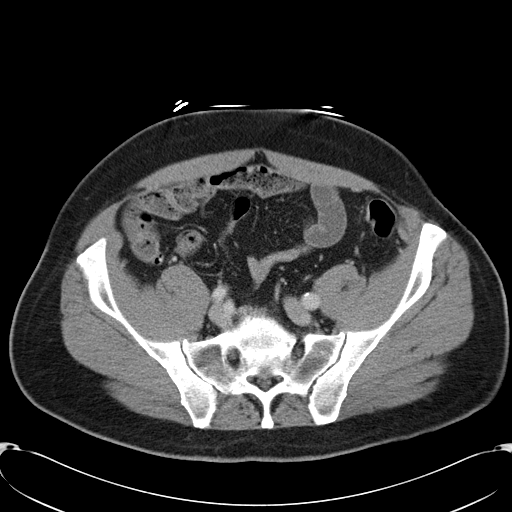
[im 43/103  soft-tissue]
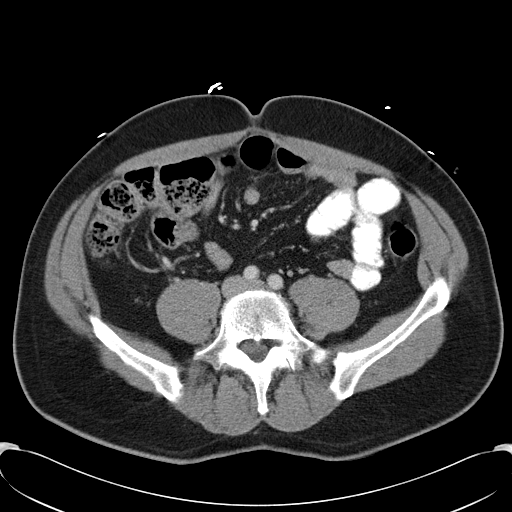
[im 52/103  soft-tissue]
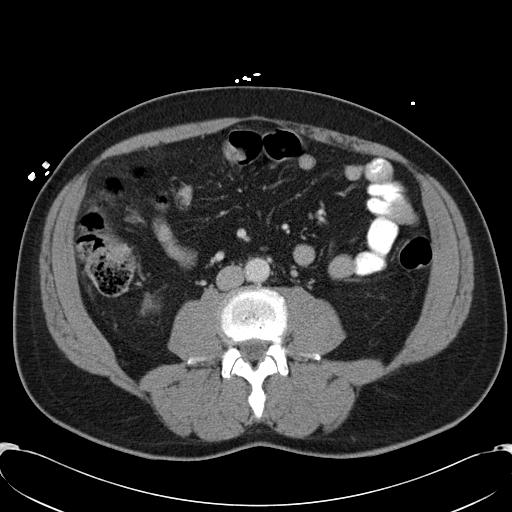
[im 60/103  soft-tissue]
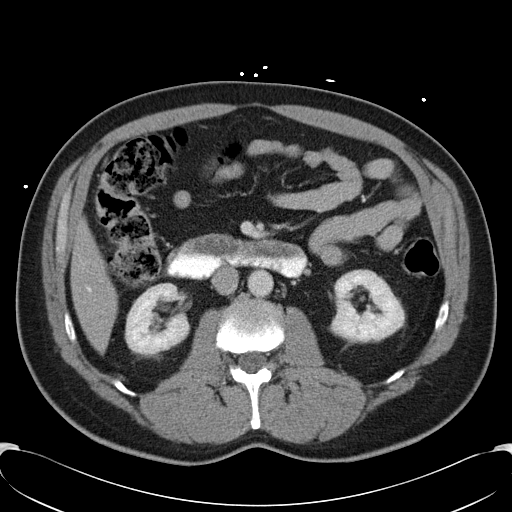
[im 69/103  soft-tissue]
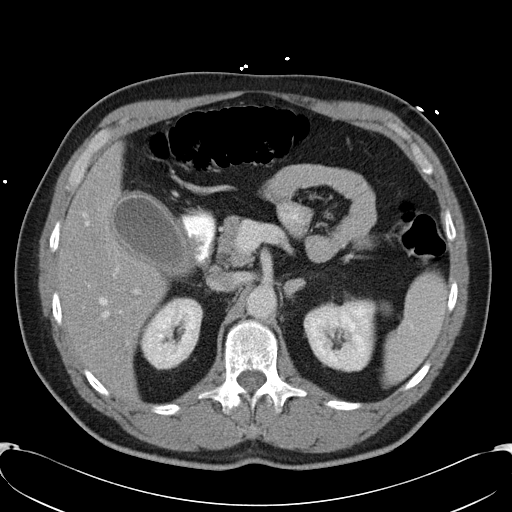
[im 69/103  bone]
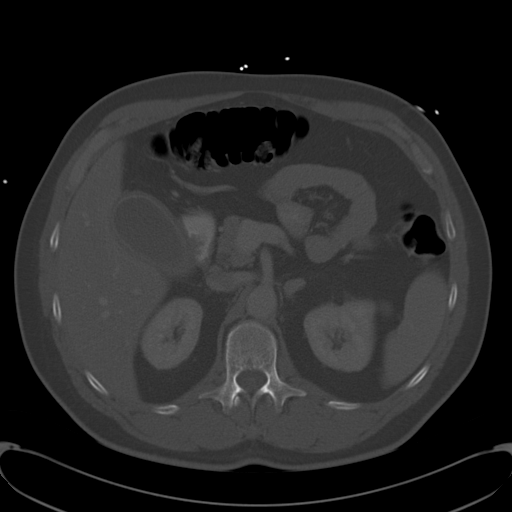
[im 73/103  soft-tissue]
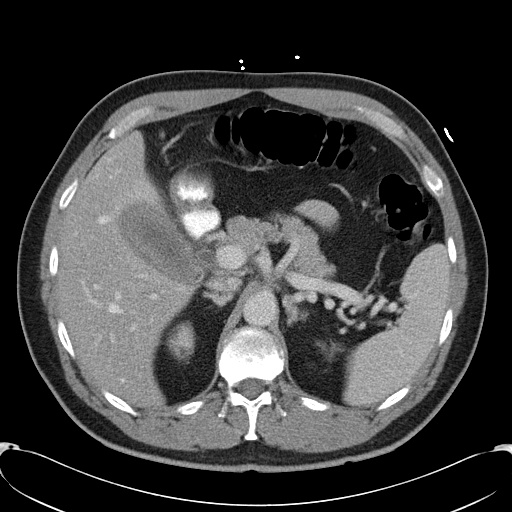
[im 81/103  soft-tissue]
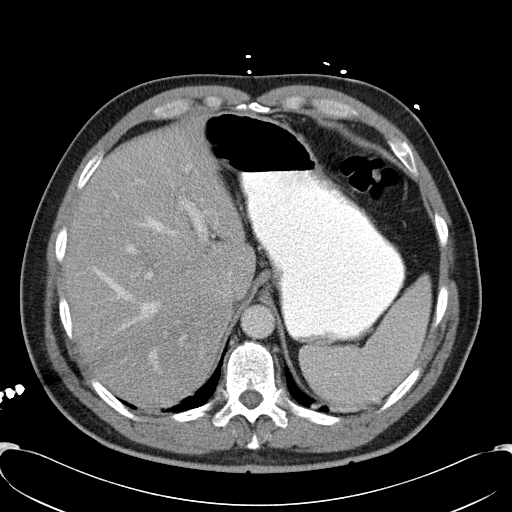
[im 86/103  lung]
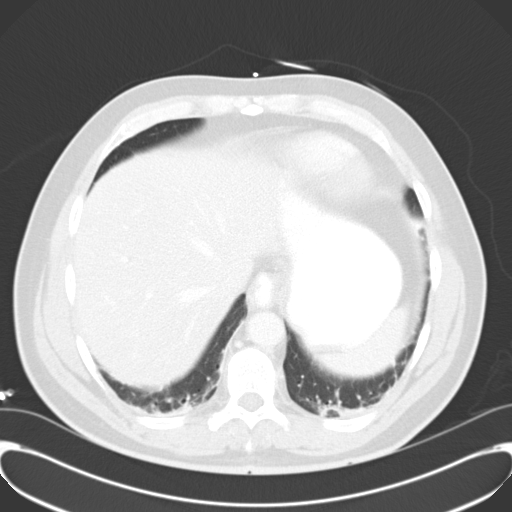
[im 90/103  soft-tissue]
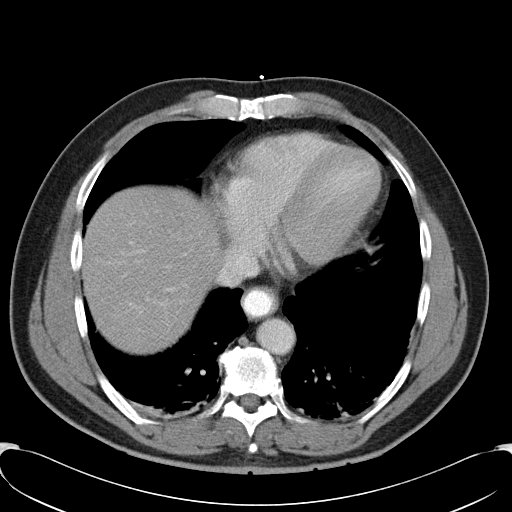
[im 90/103  lung]
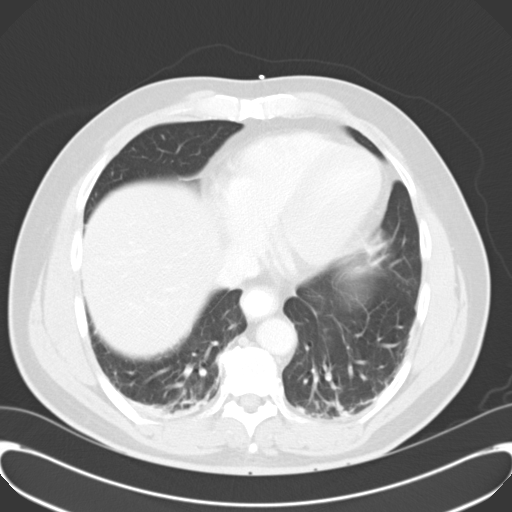
[im 94/103  lung]
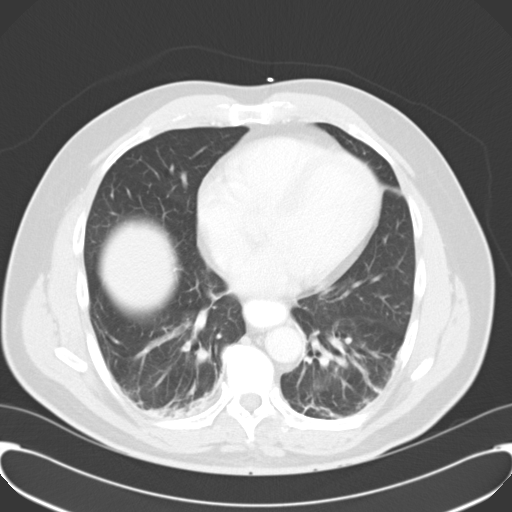
[im 98/103  soft-tissue]
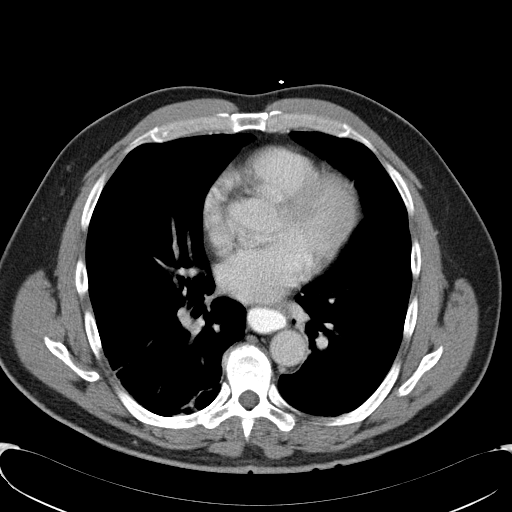
[im 98/103  lung]
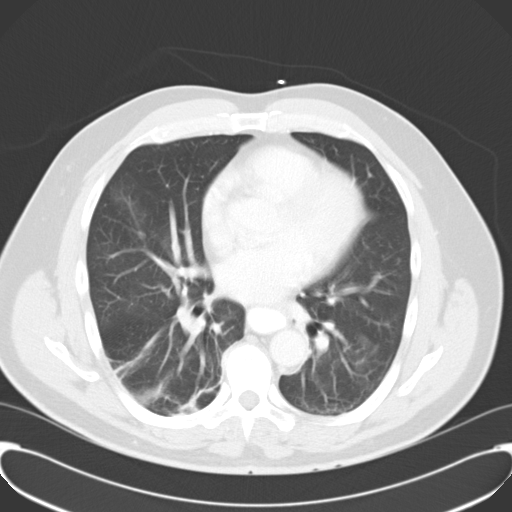

[15 of 32 positions shown; findings below may reference images not displayed]

FINDINGS: There is mild bibasilar atelectasis.  No infiltrate or
pleural fluid.  No pericardial fluid.

There is no focal hepatic lesion.  There is enhancement of the
gallbladder wall.  There is small amount of pericholecystic fluid.
Gallbladder measures 4 cm in diameter.  The common bile duct is
upper limits of normal diameter at pancreatic head measuring 6 mm.
There is mild extrahepatic ductal dilatation of the hepatic ducts.
No radiodense gallstones present.  There is no peripancreatic
inflammation.  The pancreatic duct is normal caliber.

The spleen, adrenal glands, kidneys are normal.

The stomach is normal.  The small bowel, appendix, and cecum are
normal.  The colon and rectosigmoid colon normal.

Abdominal aorta normal caliber.  No retroperitoneal periportal
lymphadenopathy.

No free fluid the pelvis.  The bladder and prostate gland normal.
No pelvic lymphadenopathy.  There is a fat filled of the left
inguinal hernia. Review of  bone windows demonstrates no aggressive
osseous lesions.
IMPRESSION: 1.  Hyperemia of the gallbladder wall with associated
pericholecystic fluid and mild extrahepatic biliary duct dilatation
suggests acute cholecystitis.  No radiodense gallstones are
identified.
2.  No evidence of pancreatitis.

The findings discussed with Dr. Tanu at 09/01/2012 at [DATE] a.m.

## 2014-02-08 IMAGING — US US ABDOMEN COMPLETE
1 series · 14 of 25 positions shown · non-contrast
Comparison: CT abdomen pelvis dated 08/20/2012

CLINICAL DATA: Abdominal pain, constipation

COMPLETE ABDOMINAL ULTRASOUND

[Series 1: us abdomen complete · 0.32mm/px · 14 of 66 slices shown]
[im 1/66]
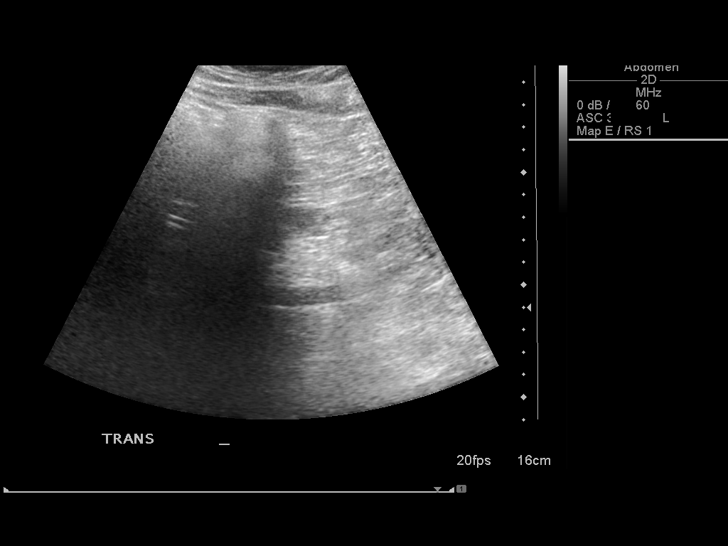
[im 6/66]
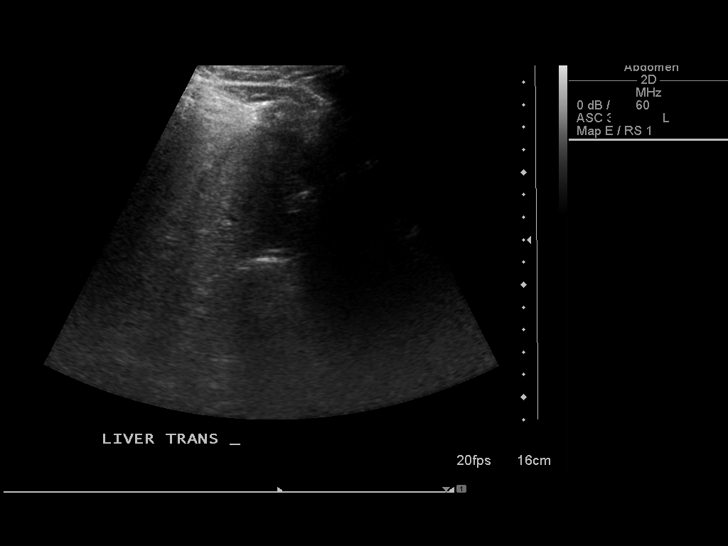
[im 11/66]
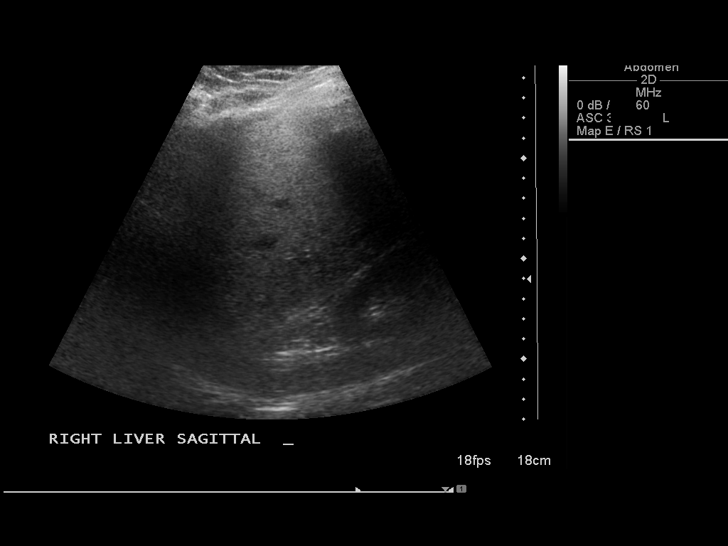
[im 17/66]
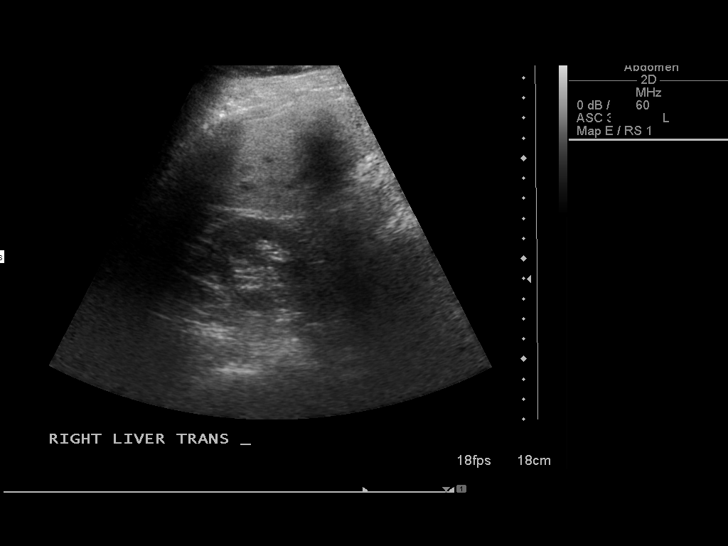
[im 22/66]
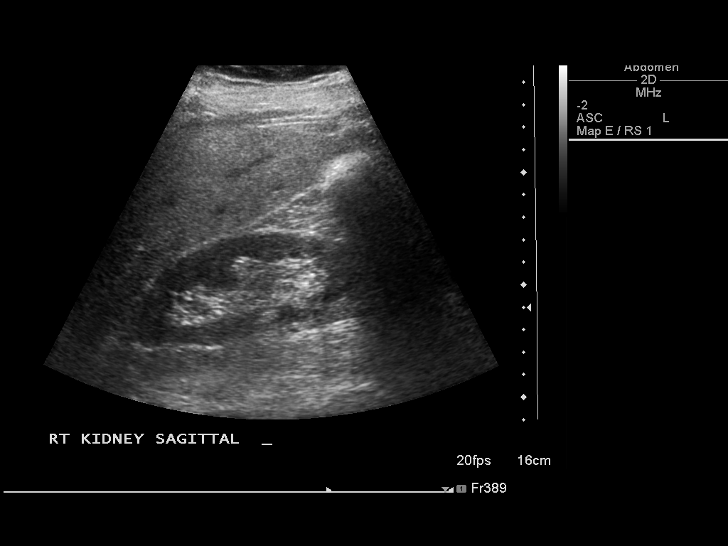
[im 25/66]
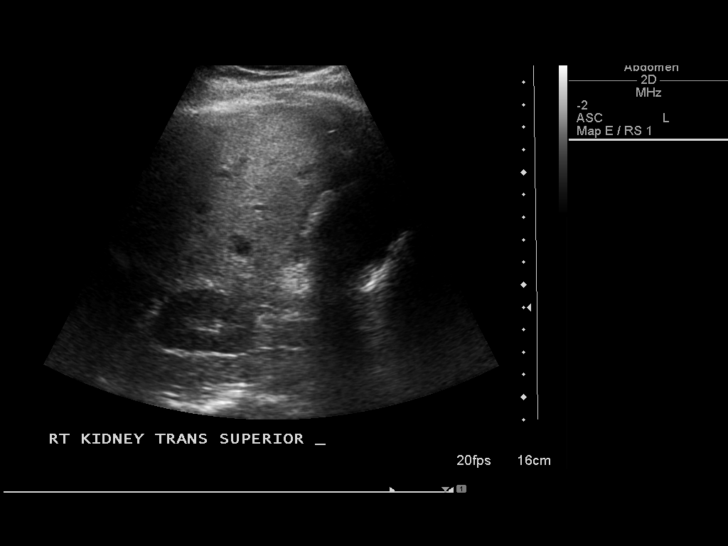
[im 30/66]
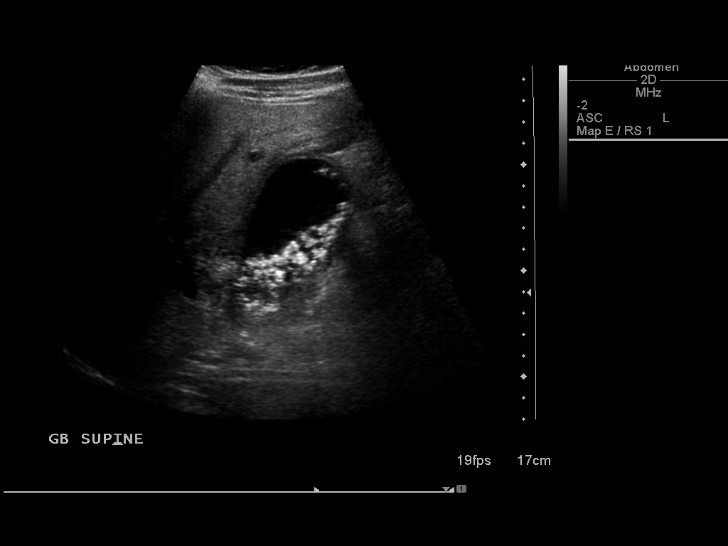
[im 36/66]
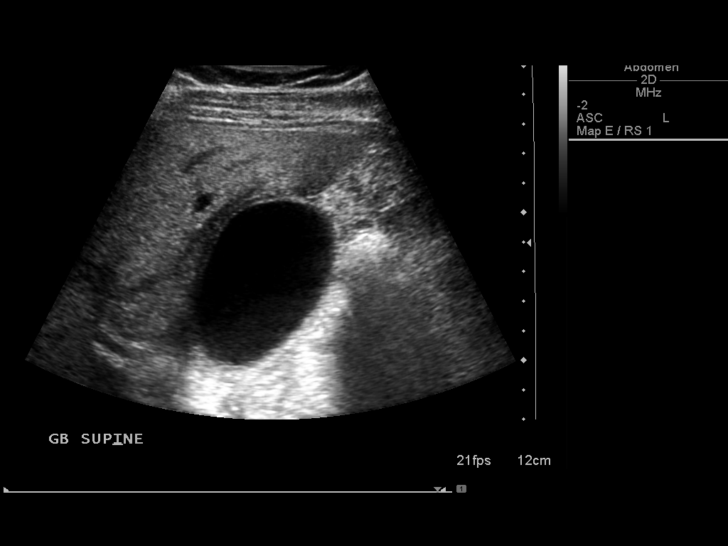
[im 41/66]
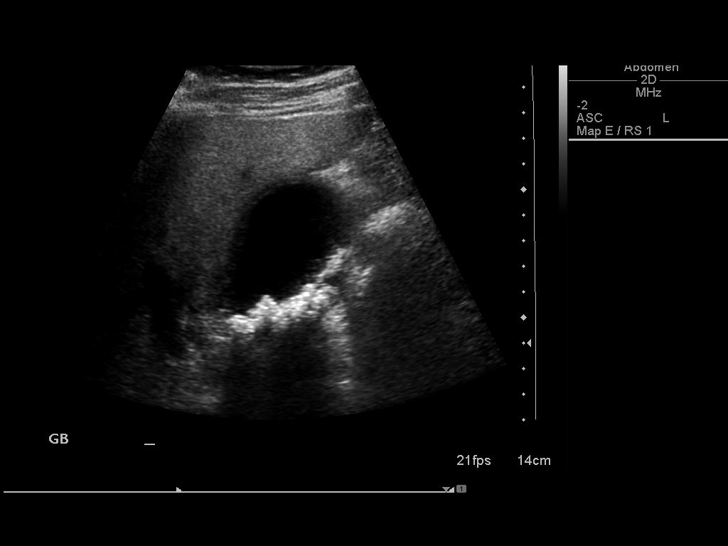
[im 44/66]
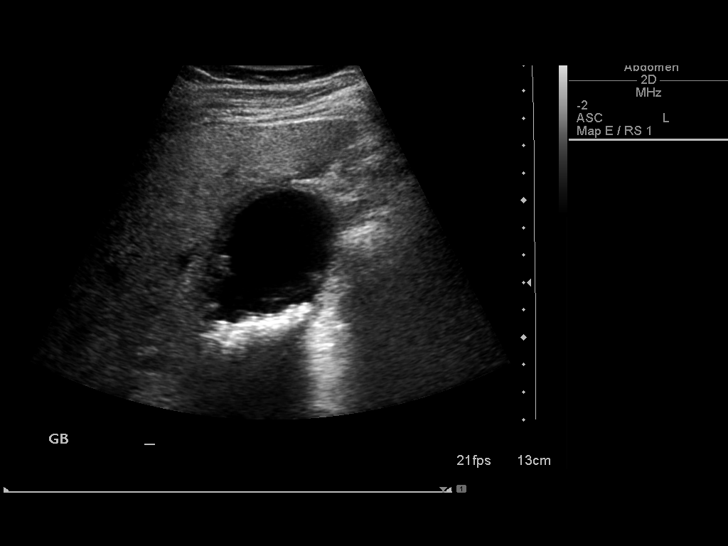
[im 49/66]
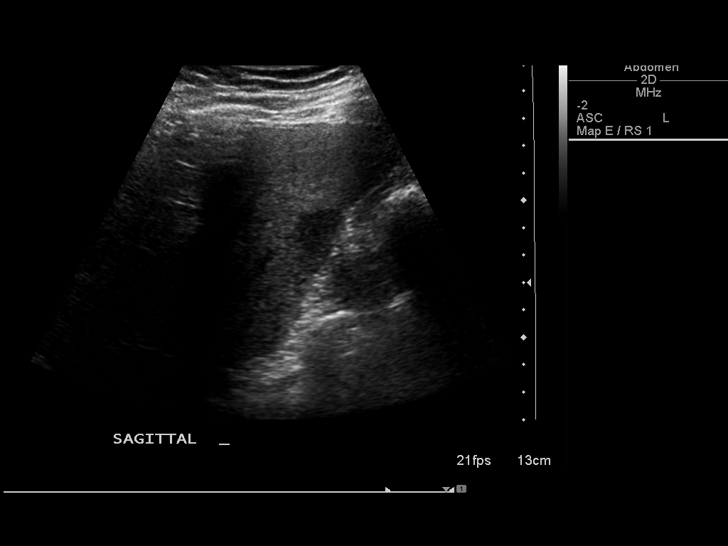
[im 55/66]
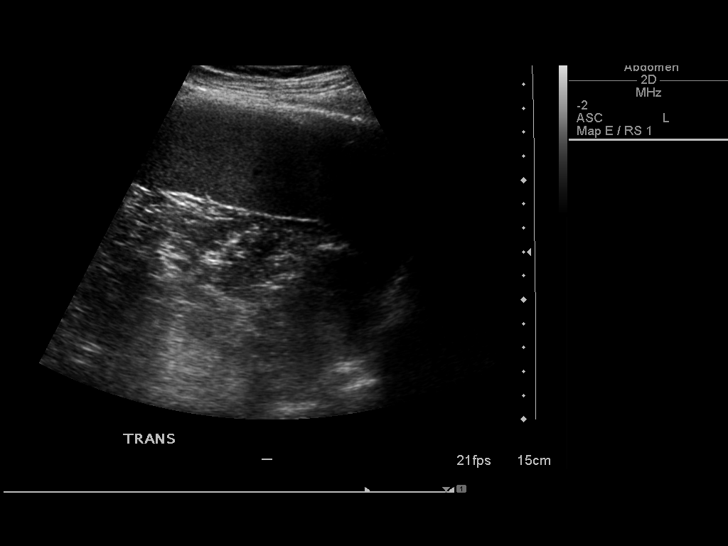
[im 60/66]
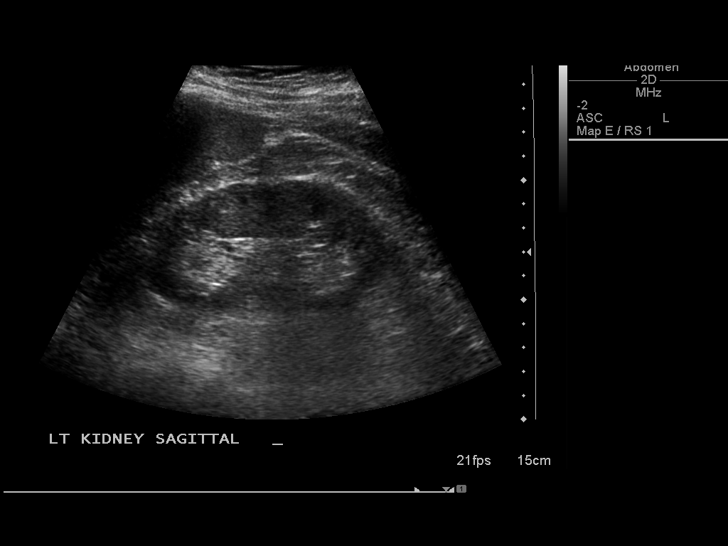
[im 66/66]
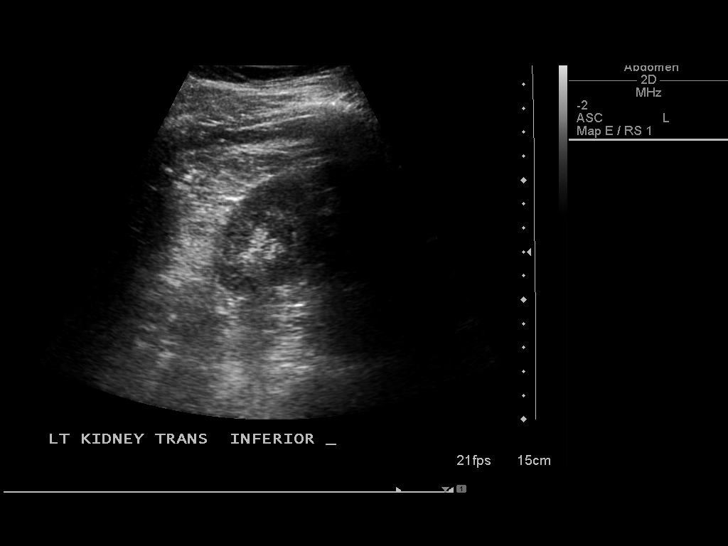

[14 of 25 positions shown; findings below may reference images not displayed]

FINDINGS: Gallbladder:  Multiple layering gallstones.  No gallbladder wall
thickening.  Trace pericholecystic fluid.  Sonographic Leydi Laura
cannot be assessed in the setting of pain medication.

Common bile duct:  Measures 7 mm.  No choledocholithiasis is seen.

Liver:  Hepatic steatosis.  Focal fatty sparing in the gallbladder
fossa.

IVC:  Not visualized due to overlying bowel gas.

Pancreas:  Not visualized due to overlying bowel gas.

Spleen:  Measures 11.6 cm.

Right Kidney:  Measures 10.8 cm.  No mass or hydronephrosis.

Left Kidney:  Measures 11.2 cm.  No mass or hydronephrosis.

Abdominal aorta:  Not visualized due to overlying bowel gas.
IMPRESSION: Multiple gallstones with trace pericholecystic fluid.  No
gallbladder wall thickening.  Sonographic Leydi Laura cannot be
assessed in the setting of pain medication.

This appearance is equivocal.  Early acute cholecystitis is
possible.  Consider hepatobiliary nuclear medicine scan for further
evaluation as clinically warranted.

Hepatic steatosis with focal fatty sparing.

## 2014-02-10 IMAGING — RF DG ERCP WO/W SPHINCTEROTOMY
1 series · 2 of 2 positions shown · non-contrast
Comparison: MRI 09/02/2012

CLINICAL DATA: Common bile duct stone.

ERCP
TECHNIQUE: C-arm fluoroscopic images were obtained
intraoperatively and submitted for postoperative interpretation.
Please see the performing provider's procedural report for the
fluoroscopy time utilized.

[Series 5: cont. · 2 of 2 slices shown]
[im 1/2]
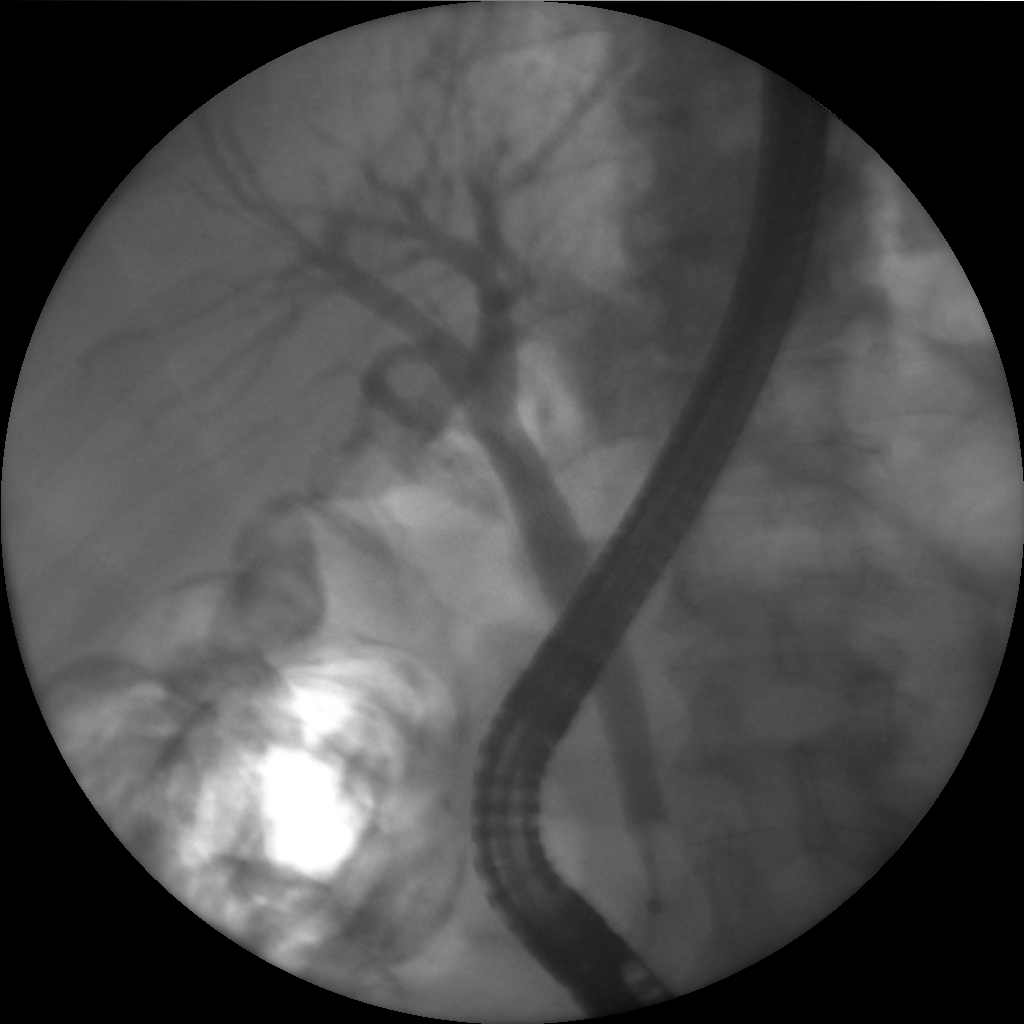
[im 2/2]
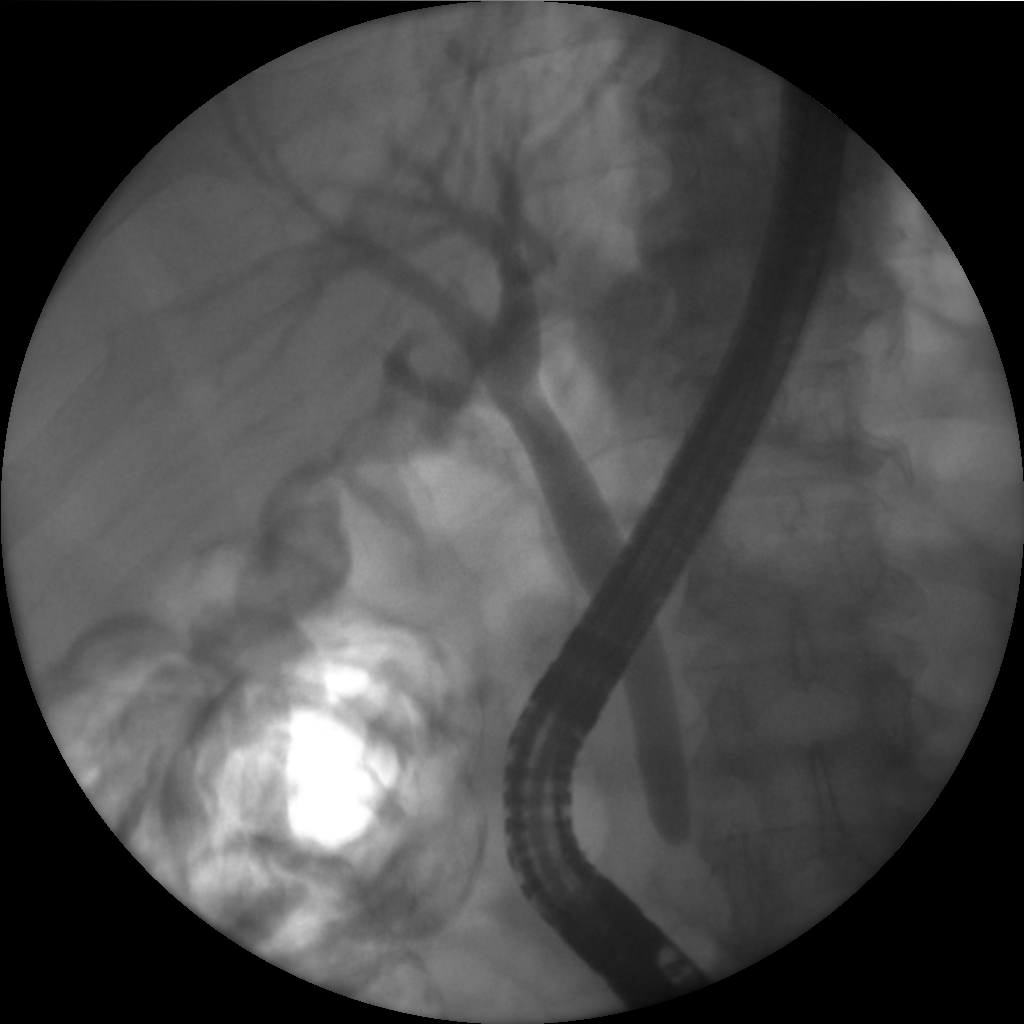

[2 of 2 positions shown; findings below may reference images not displayed]

FINDINGS: The common bile duct was cannulated and opacified.
Opacification of the extrahepatic and intrahepatic biliary system.
No large filling defect is identified on these images.  A balloon
was used for stone removal.
IMPRESSION: Balloon sweep for stone removal.  Please see the procedure report.

## 2017-07-23 ENCOUNTER — Emergency Department (HOSPITAL_COMMUNITY)
Admission: EM | Admit: 2017-07-23 | Discharge: 2017-07-23 | Disposition: A | Discharge status: Home / Self Care | Payer: Medicare Other | Attending: Emergency Medicine | Admitting: Emergency Medicine

## 2017-07-23 ENCOUNTER — Emergency Department (HOSPITAL_COMMUNITY): Payer: Medicare Other

## 2017-07-23 DIAGNOSIS — Z79899 Other long term (current) drug therapy: Secondary | ICD-10-CM | POA: Diagnosis not present

## 2017-07-23 DIAGNOSIS — N189 Chronic kidney disease, unspecified: Secondary | ICD-10-CM | POA: Insufficient documentation

## 2017-07-23 DIAGNOSIS — I129 Hypertensive chronic kidney disease with stage 1 through stage 4 chronic kidney disease, or unspecified chronic kidney disease: Secondary | ICD-10-CM | POA: Insufficient documentation

## 2017-07-23 DIAGNOSIS — R55 Syncope and collapse: Secondary | ICD-10-CM | POA: Diagnosis not present

## 2017-07-23 DIAGNOSIS — J40 Bronchitis, not specified as acute or chronic: Secondary | ICD-10-CM | POA: Diagnosis not present

## 2017-07-23 LAB — CBC WITH DIFFERENTIAL/PLATELET
BASOS PCT: 0 %
Basophils Absolute: 0 10*3/uL (ref 0.0–0.1)
EOS ABS: 0 10*3/uL (ref 0.0–0.7)
EOS PCT: 0 %
HCT: 42.9 % (ref 39.0–52.0)
HEMOGLOBIN: 15.5 g/dL (ref 13.0–17.0)
LYMPHS PCT: 5 %
Lymphs Abs: 0.7 10*3/uL (ref 0.7–4.0)
MCH: 32.5 pg (ref 26.0–34.0)
MCHC: 36.1 g/dL — ABNORMAL HIGH (ref 30.0–36.0)
MCV: 89.9 fL (ref 78.0–100.0)
Monocytes Absolute: 0.8 10*3/uL (ref 0.1–1.0)
Monocytes Relative: 6 %
Neutro Abs: 12.5 10*3/uL — ABNORMAL HIGH (ref 1.7–7.7)
Neutrophils Relative %: 89 %
PLATELETS: 137 10*3/uL — AB (ref 150–400)
RBC: 4.77 MIL/uL (ref 4.22–5.81)
RDW: 12.2 % (ref 11.5–15.5)
WBC: 14 10*3/uL — AB (ref 4.0–10.5)

## 2017-07-23 LAB — BASIC METABOLIC PANEL
ANION GAP: 9 (ref 5–15)
BUN: 14 mg/dL (ref 6–20)
CHLORIDE: 102 mmol/L (ref 101–111)
CO2: 23 mmol/L (ref 22–32)
Calcium: 8.8 mg/dL — ABNORMAL LOW (ref 8.9–10.3)
Creatinine, Ser: 1.17 mg/dL (ref 0.61–1.24)
GFR calc Af Amer: >60 mL/min (ref >60)
GFR calc non Af Amer: >60 mL/min (ref >60)
Glucose, Bld: 136 mg/dL — ABNORMAL HIGH (ref 65–99)
Potassium: 4.3 mmol/L (ref 3.5–5.1)
SODIUM: 134 mmol/L — AB (ref 135–145)

## 2017-07-23 LAB — GROUP A STREP BY PCR: GROUP A STREP BY PCR: NOT DETECTED

## 2017-07-23 MED ORDER — LACTATED RINGERS IV BOLUS: 500.0000 mL | Freq: Once | INTRAVENOUS | Status: DC

## 2017-07-23 MED ORDER — DOXYCYCLINE HYCLATE 100 MG PO CAPS: 100.0000 mg | ORAL_CAPSULE | Freq: Two times a day (BID) | ORAL | 0 refills | Status: DC

## 2017-07-23 MED ORDER — ACETAMINOPHEN 325 MG PO TABS
650.0000 mg | ORAL_TABLET | Freq: Once | ORAL | Status: AC
  Administered 2017-07-23: 650 mg via ORAL
  Filled 2017-07-23: qty 2

## 2017-07-23 MED ORDER — ALBUTEROL SULFATE HFA 108 (90 BASE) MCG/ACT IN AERS: 1.0000 | INHALATION_SPRAY | Freq: Four times a day (QID) | RESPIRATORY_TRACT | 0 refills | Status: DC | PRN

## 2017-07-23 MED ORDER — LACTATED RINGERS IV BOLUS
1000.0000 mL | Freq: Once | INTRAVENOUS | Status: AC
  Administered 2017-07-23: 1000 mL via INTRAVENOUS

## 2017-07-23 NOTE — ED Notes (Signed)
Bed: RN16 Expected date:  Expected time:  Means of arrival:  Comments: EMS- syncopal episode

## 2017-07-23 NOTE — ED Triage Notes (Signed)
Per EMS, pt is coming from his PCP for URI symptoms and experienced a syncopal episode. Per EMS pt BP was initially 80/40 and after laying down it was 120/75. Per EMS pt denies chest pain and shortness of breath. EMS reports a cough, low-grade fever, and nausea. EMS administered 500 mL NS and 4 mg zofran and reports improvement with pt. EMS reports that pt is AO x4.

## 2017-07-23 NOTE — Discharge Instructions (Signed)
Take the antibiotics only if you are not getting better. Return to the ER if you faint or feel like you are getting really sick.

## 2017-07-24 NOTE — ED Provider Notes (Signed)
[facility] DEPT Provider Note   CSN: [id number] Arrival date & time: 07/23/17  1147     History   Chief Complaint Chief Complaint  Patient presents with  . Loss of Consciousness    HPI Darren Ponce is a 67 y.o. male.  HPI  67 year old male with history of kidney stones and chronic kidney disease comes in with chief complaint of syncope.  Patient states that for the last 2 or 3 days he has been having cough, sore throat and feeling sick.  Patient went to his PCP today, where he started feeling worse and had a near syncope spell.  Patient states that he was getting up and got really dizzy and nearly fainted.  Family was around the patient and they agree with patient's history.  Patient denies any chest pain, palpitations, shortness of breath prior to the episode.  Patient also denies any vision changes or severe headaches or neck pain.  Past medical history is negative for CAD, CHF, dysrhythmia, PE, strokes.  Patient denies any heavy alcohol use or smoking.  No prior history of similar symptoms.  Review of system is positive for anorexia and poor p.o. intake.  Additionally EMS report indicates that patient's blood pressure was 80/40 when they first arrived and it improved when patient was supine and started on fluids.  Past Medical History:  Diagnosis Date  . Choledocholithiasis 09/02/2012  . Kidney stone   . PONV (postoperative nausea and vomiting)     Patient Active Problem List   Diagnosis Date Noted  . Choledocholithiasis 09/02/2012  . Acute cholecystitis 09/01/2012  . Accelerated hypertension 09/01/2012  . Leukocytosis 09/01/2012  . Abnormal LFTs 09/01/2012  . Epigastric pain 09/01/2012    Past Surgical History:  Procedure Laterality Date  . CHOLECYSTECTOMY N/A 09/04/2012   Procedure: LAPAROSCOPIC CHOLECYSTECTOMY WITH INTRAOPERATIVE CHOLANGIOGRAM;  Surgeon: Shann Medal, MD;  Location: [facility];  Service: General;  Laterality: N/A;  . ERCP  N/A 09/03/2012   Procedure: ENDOSCOPIC RETROGRADE CHOLANGIOPANCREATOGRAPHY (ERCP);  Surgeon: Gatha Mayer, MD;  Location: [facility];  Service: Endoscopy;  Laterality: N/A;  . HERNIA REPAIR          Home Medications    Prior to Admission medications   Medication Sig Start Date End Date Taking? Authorizing Provider  acetaminophen (TYLENOL) 500 MG tablet Take 1,000 mg by mouth every 6 (six) hours as needed for pain.   Yes [provider]  dextromethorphan-guaiFENesin (MUCINEX DM) 30-600 MG 12hr tablet Take 2 tablets by mouth 2 (two) times daily as needed for cough.   Yes [provider]  diphenhydrAMINE (BENADRYL) 25 MG tablet Take 25 mg by mouth daily as needed for allergies.   Yes [provider]  lisinopril (PRINIVIL,ZESTRIL) 5 MG tablet Take 2.5 mg by mouth at bedtime.  05/23/17  Yes [provider]  albuterol (PROVENTIL HFA;VENTOLIN HFA) 108 (90 Base) MCG/ACT inhaler Inhale 1-2 puffs into the lungs every 6 (six) hours as needed for wheezing or shortness of breath. 07/23/17   Fransico Meadow, PA-C  doxycycline (VIBRAMYCIN) 100 MG capsule Take 1 capsule (100 mg total) by mouth 2 (two) times daily. 07/23/17   Fransico Meadow, PA-C    Family History No family history on file.  Social History Social History   Tobacco Use  . Smoking status: Never Smoker  . Smokeless tobacco: Never Used  Substance Use Topics  . Alcohol use: Yes    Comment: occasionally  . Drug use: No  Allergies   Codeine   Review of Systems Review of Systems  Constitutional: Positive for activity change.  Respiratory: Negative for shortness of breath.   Cardiovascular: Negative for chest pain and palpitations.  Gastrointestinal: Positive for nausea.  Neurological: Positive for dizziness. Negative for syncope.  All other systems reviewed and are negative.    Physical Exam Updated Vital Signs BP 123/71 (BP Location: Left Arm)   Pulse 89   Temp 98.4 F (36.9 C)  (Oral)   Resp 20   SpO2 96%   Physical Exam  Constitutional: He is oriented to person, place, and time. He appears well-developed.  HENT:  Head: Atraumatic.  Mouth/Throat: No oropharyngeal exudate.  Neck: Neck supple.  Cardiovascular: Normal rate.  Pulmonary/Chest: Effort normal.  Abdominal: Soft.  Lymphadenopathy:    He has cervical adenopathy.  Neurological: He is alert and oriented to person, place, and time.  Skin: Skin is warm.  Nursing note and vitals reviewed.    ED Treatments / Results  Labs (all labs ordered are listed, but only abnormal results are displayed) Labs Reviewed  BASIC METABOLIC PANEL - Abnormal; Notable for the following components:      Result Value   Sodium 134 (*)    Glucose, Bld 136 (*)    Calcium 8.8 (*)    All other components within normal limits  CBC WITH DIFFERENTIAL/PLATELET - Abnormal; Notable for the following components:   WBC 14.0 (*)    MCHC 36.1 (*)    Platelets 137 (*)    Neutro Abs 12.5 (*)    All other components within normal limits  GROUP A STREP BY PCR    EKG EKG Interpretation  Date/Time:  Thursday Jul 23 2017 11:59:13 EDT Ventricular Rate:  79 PR Interval:    QRS Duration: 89 QT Interval:  373 QTC Calculation: 428 R Axis:   12 Text Interpretation:  Sinus rhythm Probable left atrial enlargement Low voltage, precordial leads No acute changes Nonspecific ST and T wave abnormality Confirmed by Varney Biles [phone]) on 07/23/2017 3:31:37 PM   Radiology Dg Chest 2 View  Result Date: 07/23/2017 CLINICAL DATA:  Cough, syncopal episode, shortness of breath EXAM: CHEST - 2 VIEW COMPARISON:  Chest x-ray of 09/01/2012 and 04/29/2006 FINDINGS: Linear scarring or atelectasis is noted at both lung bases. No pleural effusion is seen. Mediastinal and hilar contours are unremarkable. The heart is mildly enlarged. No bony abnormality is seen. IMPRESSION: Bibasilar linear opacities have increased somewhat most consistent with  atelectasis. No definite pneumonia or pleural effusion is seen. Electronically Signed   By: Ivar Drape M.D.   On: 07/23/2017 14:43    Procedures Procedures (including critical care time)  Medications Ordered in ED Medications  lactated ringers bolus 1,000 mL (0 mLs Intravenous Stopped 07/23/17 1642)  acetaminophen (TYLENOL) tablet 650 mg (650 mg Oral Given 07/23/17 1631)     Initial Impression / Assessment and Plan / ED Course  I have reviewed the triage vital signs and the nursing notes.  Pertinent labs & imaging results that were available during my care of the patient were reviewed by me and considered in my medical decision making (see chart for details).     67 year old male comes in with near syncope.  DDx includes: Orthostatic hypotension Stroke Vertebral artery dissection/stenosis Dysrhythmia PE Vasovagal/neurocardiogenic syncope Aortic stenosis Valvular disorder/Cardiomyopathy Anemia  Based on history alone it seems like this was a vasovagal type episode.  There could be an orthostasis component and we will continue with hydration  here.  We considered PE in the differential diagnosis, however patient does no have any PE risk factors and patient really did not have any pleuritic chest pain or shortness of breath.  He is not tachycardic here.  Chest x-ray ordered and does not show any direct signs of infection.  Patient does not have any cardiac history and his EKG here looks unremarkable.  Telemetry monitoring for extended period time did not reveal any dysrhythmia either.   Final Clinical Impressions(s) / ED Diagnoses   Final diagnoses:  Near syncope  Bronchitis    ED Discharge Orders        Ordered    doxycycline (VIBRAMYCIN) 100 MG capsule  2 times daily,   Status:  Discontinued     07/23/17 1619    albuterol (PROVENTIL HFA;VENTOLIN HFA) 108 (90 Base) MCG/ACT inhaler  Every 6 hours PRN,   Status:  Discontinued     07/23/17 1619    albuterol (PROVENTIL  HFA;VENTOLIN HFA) 108 (90 Base) MCG/ACT inhaler  Every 6 hours PRN     07/23/17 1628    doxycycline (VIBRAMYCIN) 100 MG capsule  2 times daily     07/23/17 1628       Varney Biles, MD 07/24/17 [phone]

## 2019-05-13 ENCOUNTER — Emergency Department (HOSPITAL_COMMUNITY)
Admission: EM | Admit: 2019-05-13 | Discharge: 2019-05-13 | Disposition: A | Discharge status: Home / Self Care | Payer: Medicare Other | Attending: Emergency Medicine | Admitting: Emergency Medicine

## 2019-05-13 ENCOUNTER — Encounter (HOSPITAL_COMMUNITY): Payer: Self-pay | Admitting: Student

## 2019-05-13 ENCOUNTER — Emergency Department (HOSPITAL_COMMUNITY): Payer: Medicare Other

## 2019-05-13 ENCOUNTER — Other Ambulatory Visit: Payer: Self-pay

## 2019-05-13 DIAGNOSIS — R197 Diarrhea, unspecified: Secondary | ICD-10-CM | POA: Insufficient documentation

## 2019-05-13 DIAGNOSIS — Z87442 Personal history of urinary calculi: Secondary | ICD-10-CM | POA: Insufficient documentation

## 2019-05-13 DIAGNOSIS — Z79899 Other long term (current) drug therapy: Secondary | ICD-10-CM | POA: Insufficient documentation

## 2019-05-13 DIAGNOSIS — I10 Essential (primary) hypertension: Secondary | ICD-10-CM | POA: Diagnosis not present

## 2019-05-13 DIAGNOSIS — R110 Nausea: Secondary | ICD-10-CM | POA: Insufficient documentation

## 2019-05-13 DIAGNOSIS — R1031 Right lower quadrant pain: Secondary | ICD-10-CM | POA: Insufficient documentation

## 2019-05-13 DIAGNOSIS — R109 Unspecified abdominal pain: Secondary | ICD-10-CM

## 2019-05-13 LAB — CBC WITH DIFFERENTIAL/PLATELET
Abs Immature Granulocytes: 0.14 10*3/uL — ABNORMAL HIGH (ref 0.00–0.07)
Basophils Absolute: 0.1 10*3/uL (ref 0.0–0.1)
Basophils Relative: 1 %
Eosinophils Absolute: 0.1 10*3/uL (ref 0.0–0.5)
Eosinophils Relative: 1 %
HCT: 45.9 % (ref 39.0–52.0)
Hemoglobin: 16 g/dL (ref 13.0–17.0)
Immature Granulocytes: 1 %
Lymphocytes Relative: 14 %
Lymphs Abs: 1.5 10*3/uL (ref 0.7–4.0)
MCH: 31.6 pg (ref 26.0–34.0)
MCHC: 34.9 g/dL (ref 30.0–36.0)
MCV: 90.7 fL (ref 80.0–100.0)
Monocytes Absolute: 0.5 10*3/uL (ref 0.1–1.0)
Monocytes Relative: 5 %
Neutro Abs: 8.1 10*3/uL — ABNORMAL HIGH (ref 1.7–7.7)
Neutrophils Relative %: 78 %
Platelets: 145 10*3/uL — ABNORMAL LOW (ref 150–400)
RBC: 5.06 MIL/uL (ref 4.22–5.81)
RDW: 12.1 % (ref 11.5–15.5)
WBC: 10.4 10*3/uL (ref 4.0–10.5)
nRBC: 0 % (ref 0.0–0.2)

## 2019-05-13 LAB — URINALYSIS, ROUTINE W REFLEX MICROSCOPIC
Bilirubin Urine: NEGATIVE
Glucose, UA: 50 mg/dL — AB
Hgb urine dipstick: NEGATIVE
Ketones, ur: 20 mg/dL — AB
Leukocytes,Ua: NEGATIVE
Nitrite: NEGATIVE
Protein, ur: NEGATIVE mg/dL
Specific Gravity, Urine: 1.013 (ref 1.005–1.030)
pH: 7 (ref 5.0–8.0)

## 2019-05-13 LAB — COMPREHENSIVE METABOLIC PANEL
ALT: 24 U/L (ref 0–44)
AST: 20 U/L (ref 15–41)
Albumin: 4.4 g/dL (ref 3.5–5.0)
Alkaline Phosphatase: 50 U/L (ref 38–126)
Anion gap: 11 (ref 5–15)
BUN: 18 mg/dL (ref 8–23)
CO2: 26 mmol/L (ref 22–32)
Calcium: 9.6 mg/dL (ref 8.9–10.3)
Chloride: 102 mmol/L (ref 98–111)
Creatinine, Ser: 1.25 mg/dL — ABNORMAL HIGH (ref 0.61–1.24)
GFR calc Af Amer: >60 mL/min (ref >60)
GFR calc non Af Amer: 59 mL/min — ABNORMAL LOW (ref >60)
Glucose, Bld: 183 mg/dL — ABNORMAL HIGH (ref 70–99)
Potassium: 4 mmol/L (ref 3.5–5.1)
Sodium: 139 mmol/L (ref 135–145)
Total Bilirubin: 0.8 mg/dL (ref 0.3–1.2)
Total Protein: 7.4 g/dL (ref 6.5–8.1)

## 2019-05-13 MED ORDER — ONDANSETRON 4 MG PO TBDP: 4.0000 mg | ORAL_TABLET | Freq: Three times a day (TID) | ORAL | 0 refills | Status: DC | PRN

## 2019-05-13 MED ORDER — HYDROMORPHONE HCL 1 MG/ML IJ SOLN
1.0000 mg | Freq: Once | INTRAMUSCULAR | Status: AC
  Administered 2019-05-13: 1 mg via INTRAVENOUS
  Filled 2019-05-13: qty 1

## 2019-05-13 MED ORDER — OXYCODONE-ACETAMINOPHEN 5-325 MG PO TABS: 1.0000 | ORAL_TABLET | ORAL | 0 refills | Status: DC | PRN

## 2019-05-13 MED ORDER — ONDANSETRON HCL 4 MG/2ML IJ SOLN
4.0000 mg | Freq: Once | INTRAMUSCULAR | Status: DC
  Administered 2019-05-13: 11:00:00 4 mg via INTRAVENOUS
  Filled 2019-05-13: qty 2

## 2019-05-13 MED ORDER — SODIUM CHLORIDE 0.9 % IV BOLUS
1000.0000 mL | Freq: Once | INTRAVENOUS | Status: AC
  Administered 2019-05-13: 1000 mL via INTRAVENOUS

## 2019-05-13 MED ORDER — LISINOPRIL 5 MG PO TABS: 5.0000 mg | ORAL_TABLET | Freq: Every day | ORAL | 0 refills | Status: AC

## 2019-05-13 NOTE — ED Provider Notes (Signed)
[facility] DEPT Provider Note   CSN: [id number] Arrival date & time: 05/13/19  1042     History Chief Complaint  Patient presents with  . Flank Pain    Darren Ponce is a 69 y.o. male with a history of nephrolithiasis, cholecystectomy, & hypertension who presents to the ED with complains of R flank pain that began 2 hours PTA. Patient states pain is in the R flank, radiates into the abdomen, constant, currently a 10/10 in severity without alleviating/aggravating factors. Reports associated nausea w/o emesis, did have a loose BM this AM. States feels somewhat similar to prior kidney stone but more severe. Denies fever, chills, chest pain, dyspnea, emesis, melena, hematochezia, dysuria, hematuria, frequency, numbness, or weakness.   HPI     Past Medical History:  Diagnosis Date  . Choledocholithiasis 09/02/2012  . Kidney stone   . PONV (postoperative nausea and vomiting)     Patient Active Problem List   Diagnosis Date Noted  . Choledocholithiasis 09/02/2012  . Acute cholecystitis 09/01/2012  . Accelerated hypertension 09/01/2012  . Leukocytosis 09/01/2012  . Abnormal LFTs 09/01/2012  . Epigastric pain 09/01/2012    Past Surgical History:  Procedure Laterality Date  . CHOLECYSTECTOMY N/A 09/04/2012   Procedure: LAPAROSCOPIC CHOLECYSTECTOMY WITH INTRAOPERATIVE CHOLANGIOGRAM;  Surgeon: Shann Medal, MD;  Location: [facility];  Service: General;  Laterality: N/A;  . ERCP N/A 09/03/2012   Procedure: ENDOSCOPIC RETROGRADE CHOLANGIOPANCREATOGRAPHY (ERCP);  Surgeon: Gatha Mayer, MD;  Location: [facility];  Service: Endoscopy;  Laterality: N/A;  . HERNIA REPAIR         History reviewed. No pertinent family history.  Social History   Tobacco Use  . Smoking status: Never Smoker  . Smokeless tobacco: Never Used  Substance Use Topics  . Alcohol use: Yes    Comment: occasionally  . Drug use: No    Home Medications Prior to Admission  medications   Medication Sig Start Date End Date Taking? Authorizing Provider  acetaminophen (TYLENOL) 500 MG tablet Take 1,000 mg by mouth every 6 (six) hours as needed for pain.   Yes [provider]  albuterol (PROVENTIL HFA;VENTOLIN HFA) 108 (90 Base) MCG/ACT inhaler Inhale 1-2 puffs into the lungs every 6 (six) hours as needed for wheezing or shortness of breath. 07/23/17  Yes Caryl Ada K, PA-C  latanoprost (XALATAN) 0.005 % ophthalmic solution Place 1 drop into the left eye daily before breakfast. 04/29/19  Yes [provider]  lisinopril (PRINIVIL,ZESTRIL) 5 MG tablet Take 2.5 mg by mouth at bedtime.  05/23/17  Yes [provider]  doxycycline (VIBRAMYCIN) 100 MG capsule Take 1 capsule (100 mg total) by mouth 2 (two) times daily. Patient not taking: Reported on 05/13/2019 07/23/17   Fransico Meadow, PA-C    Allergies    Codeine  Review of Systems   Review of Systems  Constitutional: Negative for chills and fever.  Respiratory: Negative for shortness of breath.   Cardiovascular: Negative for chest pain.  Gastrointestinal: Positive for abdominal pain, diarrhea and nausea. Negative for anal bleeding, blood in stool and vomiting.  Genitourinary: Positive for flank pain. Negative for dysuria, frequency, genital sores, scrotal swelling and testicular pain.  Neurological: Negative for weakness and numbness.  All other systems reviewed and are negative.   Physical Exam Updated Vital Signs BP (!) 206/86 (BP Location: Right Arm)   Pulse 62   Temp 97.6 F (36.4 C) (Oral)   Resp 18   SpO2 98%   Physical  Exam Vitals and nursing note reviewed.  Constitutional:      General: He is in acute distress (appears uncomfortable).     Appearance: He is well-developed. He is not toxic-appearing.  HENT:     Head: Normocephalic and atraumatic.  Eyes:     General:        Right eye: No discharge.        Left eye: No discharge.     Conjunctiva/sclera: Conjunctivae normal.    Cardiovascular:     Rate and Rhythm: Normal rate and regular rhythm.     Comments: 2+ symmetric PT pulses. Pulmonary:     Effort: Pulmonary effort is normal. No respiratory distress.     Breath sounds: Normal breath sounds. No wheezing, rhonchi or rales.  Abdominal:     General: There is no distension.     Palpations: Abdomen is soft.     Tenderness: There is abdominal tenderness (R mid abdomen). There is right CVA tenderness. There is no left CVA tenderness, guarding or rebound.  Musculoskeletal:     Cervical back: Neck supple.     Comments: Lower thoracic right-sided paraspinal muscle tenderness to palpation.  Skin:    General: Skin is warm and dry.     Findings: No rash.  Neurological:     Mental Status: He is alert.     Comments: Clear speech. Sensation grossly intact to bilateral lower extremities. 5/5 symmetric strength with plantar dorsiflexion bilaterally.   Psychiatric:        Behavior: Behavior normal.     ED Results / Procedures / Treatments   Labs (all labs ordered are listed, but only abnormal results are displayed) Labs Reviewed  COMPREHENSIVE METABOLIC PANEL - Abnormal; Notable for the following components:      Result Value   Glucose, Bld 183 (*)    Creatinine, Ser 1.25 (*)    GFR calc non Af Amer 59 (*)    All other components within normal limits  CBC WITH DIFFERENTIAL/PLATELET - Abnormal; Notable for the following components:   Platelets 145 (*)    Neutro Abs 8.1 (*)    Abs Immature Granulocytes 0.14 (*)    All other components within normal limits  URINALYSIS, ROUTINE W REFLEX MICROSCOPIC - Abnormal; Notable for the following components:   Color, Urine STRAW (*)    Glucose, UA 50 (*)    Ketones, ur 20 (*)    All other components within normal limits    EKG None  Radiology CT Renal Stone Study  Result Date: 05/13/2019 CLINICAL DATA:  Right flank pain for the past 2 hours. History of renal calculi. EXAM: CT ABDOMEN AND PELVIS WITHOUT CONTRAST  TECHNIQUE: Multidetector CT imaging of the abdomen and pelvis was performed following the standard protocol without IV contrast. COMPARISON:  CT scan from 2014. FINDINGS: Lower chest: Stable basilar scarring changes and mild bronchiectasis. No worrisome pulmonary lesions or acute pulmonary findings. The heart is normal in size. No pericardial effusion. Hepatobiliary: Pneumobilia noted and likely related to prior cholecystectomy and sphincterotomy. No worrisome hepatic lesions or intrahepatic biliary dilatation. The common bile duct is within normal limits in caliber given the prior cholecystectomy. Pancreas: No mass, inflammation or ductal dilatation. Spleen: Normal size. No focal lesions. Adrenals/Urinary Tract: The adrenal glands are normal. Small left renal calculi. No right-sided renal calculi. No obstructing ureteral calculi or hydroureteronephrosis. No bladder calculi. No worrisome renal lesions are identified without contrast. No bladder lesions. Stomach/Bowel: The stomach, duodenum, small bowel and colon are grossly  normal without oral contrast. No inflammatory changes, mass lesions or obstructive findings. The appendix is normal. Vascular/Lymphatic: The aorta is normal in caliber. Scattered atheroscerlotic calcifications. No mesenteric or retroperitoneal mass or adenopathy. Small scattered lymph nodes are noted. Reproductive: The prostate gland and seminal vesicles are unremarkable. Right-sided scrotal hydrocele noted. Other: No pelvic mass or adenopathy. No free pelvic fluid collections. No inguinal mass or adenopathy. No abdominal wall hernia or subcutaneous lesions. Musculoskeletal: No significant bony findings. IMPRESSION: 1. Small left renal calculi but no obstructing ureteral calculi or bladder calculi. 2. No acute abdominal/pelvic findings, mass lesions or adenopathy. 3. Status post cholecystectomy with pneumobilia. Electronically Signed   By: Marijo Sanes M.D.   On: 05/13/2019 12:20     Procedures Procedures (including critical care time)  Medications Ordered in ED Medications  sodium chloride 0.9 % bolus 1,000 mL (has no administration in time range)  HYDROmorphone (DILAUDID) injection 1 mg (has no administration in time range)  ondansetron (ZOFRAN) injection 4 mg (has no administration in time range)    ED Course  I have reviewed the triage vital signs and the nursing notes.  Pertinent labs & imaging results that were available during my care of the patient were reviewed by me and considered in my medical decision making (see chart for details).    Darren Ponce was evaluated in Emergency Department on 05/13/2019 for the symptoms described in the history of present illness. He/she was evaluated in the context of the global COVID-19 pandemic, which necessitated consideration that the patient might be at risk for infection with the SARS-CoV-2 virus that causes COVID-19. Institutional protocols and algorithms that pertain to the evaluation of patients at risk for COVID-19 are in a state of rapid change based on information released by regulatory bodies including the CDC and federal and state organizations. These policies and algorithms were followed during the patient's care in the ED.  MDM Rules/Calculators/A&P                      Patient presents to the ED with complaints of R flank pain. Appears uncomfortable, noted to be hypertensive. R CVA tenderness, R thoracic paraspinal muscle tenderness, & R mid abdominal tenderness on exam. No peritoneal signs. DDX: Kidney stone, pyelo, MSK, dissection, perf, obstruction, appendicitis.   CBC: No anemia or leukocytosis. Thrombocytopenia similar to prior on chart review.  CMP: mildly elevated creatinine compared to prior- fluids given. No significant electrolyte derangement.  UA: No UTI or hematuria.   CT renal stone study: 1. Small left renal calculi but no obstructing ureteral calculi or bladder calculi. 2. No acute  abdominal/pelvic findings, mass lesions or adenopathy. 3. Status post cholecystectomy with pneumobilia  Overall reassuring work-up in the emergency department.  No signs of ureteral or obstructing calculi.  Appendix is normal.  Patient is status post cholecystectomy.  No signs of obstruction. Aorta caliber is normal. Unclear definitive etiology, pain improved some with analgesics in the emergency department. Discussed findings and plan of care with supervising physician Dr. Zenia Resides who has evaluated patient- will give 1 additional dose of analgesics in the ED, DC home with additional pain control and increase in lisinopril from 2.5 mg to 5 mg which I am in agreement with. We discussed results, treatment plan, need for follow-up, and return precautions with the patient as well as with his wife via telephone. Provided opportunity for questions, patient & his wife confirmed understanding and are in agreement with plan.    Final  Clinical Impression(s) / ED Diagnoses Final diagnoses:  Flank pain  Hypertension, unspecified type    Rx / DC Orders ED Discharge Orders         Ordered    lisinopril (ZESTRIL) 5 MG tablet  Daily at bedtime     05/13/19 1412    oxyCODONE-acetaminophen (PERCOCET/ROXICET) 5-325 MG tablet  Every 4 hours PRN     05/13/19 1412    ondansetron (ZOFRAN ODT) 4 MG disintegrating tablet  Every 8 hours PRN     05/13/19 1412           Zyad Boomer, Glynda Jaeger, PA-C 05/13/19 1425    Lacretia Leigh, MD 05/16/19 1212

## 2019-05-13 NOTE — ED Notes (Signed)
Wife called and updated, she will call back when she is here to pick up the patient.

## 2019-05-13 NOTE — ED Provider Notes (Signed)
Medical screening examination/treatment/procedure(s) were conducted as a shared visit with non-physician practitioner(s) and myself.  I personally evaluated the patient during the encounter.    69 year old male presents with right flank pain similar to his kidney stones.  Work-up here is negative for renal stones.  Medicated for pain does feel slightly better.  Blood pressure elevated noted and adjust patient's dose of lisinopril.  Return precautions given   Lacretia Leigh, MD 05/13/19 1352

## 2019-05-13 NOTE — ED Notes (Signed)
Darren Ponce, wife would like an update on her husbands condition, [phone].

## 2019-05-13 NOTE — Discharge Instructions (Addendum)
You were seen in the emergency department today for flank pain.  Your CT scan does not show any active kidney stones in your ureters, there is a stone within the left kidney.  Your CT scan was overall reassuring.  Your labs were that your kidney function was very mildly elevated compared to prior, please have this rechecked by your primary care provider.  We are unsure the exact cause of your pain.  We are sending him with the following medicines to help with your symptoms:  -Percocet-this is a narcotic/controlled substance medication that has potential addicting qualities.  We recommend that you take 1-2 tablets every 6 hours as needed for severe pain.  Do not drive or operate heavy machinery when taking this medicine as it can be sedating. Do not drink alcohol or take other sedating medications when taking this medicine for safety reasons.  Keep this out of reach of small children.  Please be aware this medicine has Tylenol in it (325 mg/tab) do not exceed the maximum dose of Tylenol in a day per over the counter recommendations should you decide to supplement with Tylenol over the counter.   - Zofran-please take this every 8 hours as needed for nausea and vomiting.  We are increasing your blood pressure medication from 2.5 mg tablets to 5 mg tablets, please take this daily as prescribed.  We have prescribed you new medication(s) today. Discuss the medications prescribed today with your pharmacist as they can have adverse effects and interactions with your other medicines including over the counter and prescribed medications. Seek medical evaluation if you start to experience new or abnormal symptoms after taking one of these medicines, seek care immediately if you start to experience difficulty breathing, feeling of your throat closing, facial swelling, or rash as these could be indications of a more serious allergic reaction  Please follow-up with your primary care provider within 72 hours for  reevaluation of your symptoms as well as a recheck of your blood pressure.  Return to the emergency department for new or worsening symptoms including worsening pain, chest pain, trouble breathing, fever, passing out, numbness, weakness, or any other concerns.

## 2019-05-13 NOTE — ED Triage Notes (Signed)
Arrives via EMS from home, C/C R flank pain, hx of kidney stones. Pain started about 2 hours ago, endorses nausea w/o vomiting, some diarrhea. EMS gave 200 mch Fentanyl, IV and 4 mg Zofran. Patient alert and oriented, ambulatory.

## 2019-11-14 ENCOUNTER — Telehealth: Payer: Self-pay | Admitting: Unknown Physician Specialty

## 2019-11-14 ENCOUNTER — Other Ambulatory Visit: Payer: Self-pay

## 2019-11-14 ENCOUNTER — Other Ambulatory Visit: Payer: Self-pay | Admitting: Unknown Physician Specialty

## 2019-11-14 ENCOUNTER — Emergency Department (HOSPITAL_COMMUNITY)
Admission: EM | Admit: 2019-11-14 | Discharge: 2019-11-14 | Disposition: A | Discharge status: Home / Self Care | Payer: Medicare Other | Attending: Emergency Medicine | Admitting: Emergency Medicine

## 2019-11-14 ENCOUNTER — Encounter (HOSPITAL_COMMUNITY): Payer: Self-pay | Admitting: Emergency Medicine

## 2019-11-14 DIAGNOSIS — Z8616 Personal history of COVID-19: Secondary | ICD-10-CM | POA: Diagnosis not present

## 2019-11-14 DIAGNOSIS — E860 Dehydration: Secondary | ICD-10-CM | POA: Diagnosis present

## 2019-11-14 DIAGNOSIS — Z5321 Procedure and treatment not carried out due to patient leaving prior to being seen by health care provider: Secondary | ICD-10-CM | POA: Diagnosis not present

## 2019-11-14 NOTE — ED Notes (Signed)
Pt gave labels to registration then left facility

## 2019-11-14 NOTE — ED Triage Notes (Signed)
Per pt, states he was diagnosed with covid yesterday-states he is dehydrated from vomting-here to get some fluids

## 2019-11-14 NOTE — Telephone Encounter (Signed)
MAB addendum- Pt is dehydrated and plans to go to the ER for rehydration.  He is on the schedule for tomorrow at 10:30A in case discharged without mab in the ER.

## 2019-11-14 NOTE — Telephone Encounter (Signed)
I connected by phone with Darren Ponce on 11/14/2019 at 9:40 AM to discuss the potential use of a new treatment for mild to moderate COVID-19 viral infection in non-hospitalized patients.  This patient is a 69 y.o. male that meets the FDA criteria for Emergency Use Authorization of COVID monoclonal antibody casirivimab/imdevimab.  Has a (+) direct SARS-CoV-2 viral test result  Has mild or moderate COVID-19   Is NOT hospitalized due to COVID-19  Is within 10 days of symptom onset  Has at least one of the high risk factor(s) for progression to severe COVID-19 and/or hospitalization as defined in EUA.  Specific high risk criteria : Older age (>/= 69 yo)   I have spoken and communicated the following to the patient or parent/caregiver regarding COVID monoclonal antibody treatment:  1. FDA has authorized the emergency use for the treatment of mild to moderate COVID-19 in adults and pediatric patients with positive results of direct SARS-CoV-2 viral testing who are 81 years of age and older weighing at least 40 kg, and who are at high risk for progressing to severe COVID-19 and/or hospitalization.  2. The significant known and potential risks and benefits of COVID monoclonal antibody, and the extent to which such potential risks and benefits are unknown.  3. Information on available alternative treatments and the risks and benefits of those alternatives, including clinical trials.  4. Patients treated with COVID monoclonal antibody should continue to self-isolate and use infection control measures (e.g., wear mask, isolate, social distance, avoid sharing personal items, clean and disinfect "high touch" surfaces, and frequent handwashing) according to CDC guidelines.   5. The patient or parent/caregiver has the option to accept or refuse COVID monoclonal antibody treatment.  After reviewing this information with the patient, The patient agreed to proceed with receiving casirivimab\imdevimab  infusion and will be provided a copy of the Fact sheet prior to receiving the infusion. Darren Ponce 11/14/2019 9:40 AM  Sx onset 8/20

## 2019-11-15 ENCOUNTER — Ambulatory Visit (HOSPITAL_COMMUNITY)
Admission: RE | Admit: 2019-11-15 | Discharge: 2019-11-15 | Disposition: A | Discharge status: Home / Self Care | Payer: Medicare Other | Source: Ambulatory Visit | Attending: Pulmonary Disease | Admitting: Pulmonary Disease

## 2019-11-15 ENCOUNTER — Other Ambulatory Visit (HOSPITAL_COMMUNITY): Payer: Self-pay | Admitting: Oncology

## 2019-11-15 DIAGNOSIS — U071 COVID-19: Secondary | ICD-10-CM | POA: Diagnosis not present

## 2019-11-15 DIAGNOSIS — Z23 Encounter for immunization: Secondary | ICD-10-CM | POA: Diagnosis present

## 2019-11-15 MED ORDER — METHYLPREDNISOLONE SODIUM SUCC 125 MG IJ SOLR: 125.0000 mg | Freq: Once | INTRAMUSCULAR | Status: DC | PRN

## 2019-11-15 MED ORDER — FAMOTIDINE IN NACL 20-0.9 MG/50ML-% IV SOLN: 20.0000 mg | Freq: Once | INTRAVENOUS | Status: DC | PRN

## 2019-11-15 MED ORDER — EPINEPHRINE 0.3 MG/0.3ML IJ SOAJ: 0.3000 mg | Freq: Once | INTRAMUSCULAR | Status: DC | PRN

## 2019-11-15 MED ORDER — ALBUTEROL SULFATE HFA 108 (90 BASE) MCG/ACT IN AERS: 2.0000 | INHALATION_SPRAY | Freq: Once | RESPIRATORY_TRACT | Status: DC | PRN

## 2019-11-15 MED ORDER — SODIUM CHLORIDE 0.9 % IV SOLN: INTRAVENOUS | Status: DC | PRN

## 2019-11-15 MED ORDER — SODIUM CHLORIDE 0.9 % IV SOLN
1200.0000 mg | Freq: Once | INTRAVENOUS | Status: AC
  Administered 2019-11-15: 1200 mg via INTRAVENOUS
  Filled 2019-11-15: qty 10

## 2019-11-15 MED ORDER — DIPHENHYDRAMINE HCL 50 MG/ML IJ SOLN: 50.0000 mg | Freq: Once | INTRAMUSCULAR | Status: DC | PRN

## 2019-11-15 NOTE — Progress Notes (Signed)
  Diagnosis: COVID-19  Physician: Dr. Joya Gaskins  Procedure: [facility] Med: casirivimab\imdevimab infusion - Provided patient with casirivimab\imdevimab fact sheet for patients, parents and caregivers prior to infusion.  Complications: No immediate complications noted.  Discharge: Discharged home   [name] 11/15/2019

## 2019-11-15 NOTE — Discharge Instructions (Signed)

## 2020-04-02 DIAGNOSIS — U071 COVID-19: Secondary | ICD-10-CM | POA: Diagnosis not present

## 2020-04-11 DIAGNOSIS — R6889 Other general symptoms and signs: Secondary | ICD-10-CM | POA: Diagnosis not present

## 2020-05-02 DIAGNOSIS — Z8616 Personal history of COVID-19: Secondary | ICD-10-CM | POA: Diagnosis not present

## 2020-05-02 DIAGNOSIS — N451 Epididymitis: Secondary | ICD-10-CM | POA: Diagnosis not present

## 2020-05-22 DIAGNOSIS — L570 Actinic keratosis: Secondary | ICD-10-CM | POA: Diagnosis not present

## 2020-05-22 DIAGNOSIS — L819 Disorder of pigmentation, unspecified: Secondary | ICD-10-CM | POA: Diagnosis not present

## 2020-05-22 DIAGNOSIS — D1801 Hemangioma of skin and subcutaneous tissue: Secondary | ICD-10-CM | POA: Diagnosis not present

## 2020-06-08 DIAGNOSIS — Z8719 Personal history of other diseases of the digestive system: Secondary | ICD-10-CM | POA: Diagnosis not present

## 2020-06-08 DIAGNOSIS — K5900 Constipation, unspecified: Secondary | ICD-10-CM | POA: Diagnosis not present

## 2020-06-13 ENCOUNTER — Ambulatory Visit
Admission: RE | Admit: 2020-06-13 | Discharge: 2020-06-13 | Disposition: A | Discharge status: Home / Self Care | Payer: Medicare Other | Source: Ambulatory Visit | Attending: Family Medicine | Admitting: Family Medicine

## 2020-06-13 ENCOUNTER — Other Ambulatory Visit: Payer: Self-pay

## 2020-06-13 ENCOUNTER — Other Ambulatory Visit: Payer: Self-pay | Admitting: Family Medicine

## 2020-06-13 DIAGNOSIS — R109 Unspecified abdominal pain: Secondary | ICD-10-CM | POA: Diagnosis not present

## 2020-06-13 DIAGNOSIS — Z87442 Personal history of urinary calculi: Secondary | ICD-10-CM

## 2020-07-18 DIAGNOSIS — K4090 Unilateral inguinal hernia, without obstruction or gangrene, not specified as recurrent: Secondary | ICD-10-CM | POA: Diagnosis not present

## 2020-07-27 DIAGNOSIS — K5900 Constipation, unspecified: Secondary | ICD-10-CM | POA: Diagnosis not present

## 2020-07-27 DIAGNOSIS — R1032 Left lower quadrant pain: Secondary | ICD-10-CM | POA: Diagnosis not present

## 2020-08-08 DIAGNOSIS — K4091 Unilateral inguinal hernia, without obstruction or gangrene, recurrent: Secondary | ICD-10-CM | POA: Diagnosis not present

## 2020-08-28 DIAGNOSIS — H401121 Primary open-angle glaucoma, left eye, mild stage: Secondary | ICD-10-CM | POA: Diagnosis not present

## 2020-09-06 DIAGNOSIS — K4091 Unilateral inguinal hernia, without obstruction or gangrene, recurrent: Secondary | ICD-10-CM | POA: Diagnosis not present

## 2020-10-22 DIAGNOSIS — K4091 Unilateral inguinal hernia, without obstruction or gangrene, recurrent: Secondary | ICD-10-CM | POA: Diagnosis not present

## 2020-11-05 DIAGNOSIS — K5900 Constipation, unspecified: Secondary | ICD-10-CM | POA: Diagnosis not present

## 2020-12-24 DIAGNOSIS — H401121 Primary open-angle glaucoma, left eye, mild stage: Secondary | ICD-10-CM | POA: Diagnosis not present

## 2021-01-04 DIAGNOSIS — I129 Hypertensive chronic kidney disease with stage 1 through stage 4 chronic kidney disease, or unspecified chronic kidney disease: Secondary | ICD-10-CM | POA: Diagnosis not present

## 2021-01-04 DIAGNOSIS — I700 Atherosclerosis of aorta: Secondary | ICD-10-CM | POA: Diagnosis not present

## 2021-01-04 DIAGNOSIS — Z0000 Encounter for general adult medical examination without abnormal findings: Secondary | ICD-10-CM | POA: Diagnosis not present

## 2021-01-04 DIAGNOSIS — R7303 Prediabetes: Secondary | ICD-10-CM | POA: Diagnosis not present

## 2021-01-04 DIAGNOSIS — Z1389 Encounter for screening for other disorder: Secondary | ICD-10-CM | POA: Diagnosis not present

## 2021-01-04 DIAGNOSIS — N1831 Chronic kidney disease, stage 3a: Secondary | ICD-10-CM | POA: Diagnosis not present

## 2021-03-14 DIAGNOSIS — Z20822 Contact with and (suspected) exposure to covid-19: Secondary | ICD-10-CM | POA: Diagnosis not present

## 2021-03-14 DIAGNOSIS — J029 Acute pharyngitis, unspecified: Secondary | ICD-10-CM | POA: Diagnosis not present

## 2021-03-14 DIAGNOSIS — J069 Acute upper respiratory infection, unspecified: Secondary | ICD-10-CM | POA: Diagnosis not present

## 2021-03-14 DIAGNOSIS — R051 Acute cough: Secondary | ICD-10-CM | POA: Diagnosis not present

## 2021-04-29 DIAGNOSIS — I129 Hypertensive chronic kidney disease with stage 1 through stage 4 chronic kidney disease, or unspecified chronic kidney disease: Secondary | ICD-10-CM | POA: Diagnosis not present

## 2021-04-29 DIAGNOSIS — G5601 Carpal tunnel syndrome, right upper limb: Secondary | ICD-10-CM | POA: Diagnosis not present

## 2021-04-29 DIAGNOSIS — R7303 Prediabetes: Secondary | ICD-10-CM | POA: Diagnosis not present

## 2021-05-07 DIAGNOSIS — M79642 Pain in left hand: Secondary | ICD-10-CM | POA: Diagnosis not present

## 2021-05-07 DIAGNOSIS — M180 Bilateral primary osteoarthritis of first carpometacarpal joints: Secondary | ICD-10-CM | POA: Diagnosis not present

## 2021-05-07 DIAGNOSIS — G5603 Carpal tunnel syndrome, bilateral upper limbs: Secondary | ICD-10-CM | POA: Diagnosis not present

## 2021-05-07 DIAGNOSIS — M79641 Pain in right hand: Secondary | ICD-10-CM | POA: Diagnosis not present

## 2021-05-14 DIAGNOSIS — M7661 Achilles tendinitis, right leg: Secondary | ICD-10-CM | POA: Diagnosis not present

## 2021-05-14 DIAGNOSIS — N1831 Chronic kidney disease, stage 3a: Secondary | ICD-10-CM | POA: Diagnosis not present

## 2021-05-15 DIAGNOSIS — G5613 Other lesions of median nerve, bilateral upper limbs: Secondary | ICD-10-CM | POA: Diagnosis not present

## 2021-05-20 DIAGNOSIS — M180 Bilateral primary osteoarthritis of first carpometacarpal joints: Secondary | ICD-10-CM | POA: Diagnosis not present

## 2021-05-20 DIAGNOSIS — R52 Pain, unspecified: Secondary | ICD-10-CM | POA: Diagnosis not present

## 2021-05-20 DIAGNOSIS — G5603 Carpal tunnel syndrome, bilateral upper limbs: Secondary | ICD-10-CM | POA: Diagnosis not present

## 2021-06-04 ENCOUNTER — Other Ambulatory Visit: Payer: Self-pay | Admitting: Orthopedic Surgery

## 2021-06-04 DIAGNOSIS — G5603 Carpal tunnel syndrome, bilateral upper limbs: Secondary | ICD-10-CM | POA: Diagnosis not present

## 2021-06-10 DIAGNOSIS — M7662 Achilles tendinitis, left leg: Secondary | ICD-10-CM | POA: Diagnosis not present

## 2021-06-10 DIAGNOSIS — M7661 Achilles tendinitis, right leg: Secondary | ICD-10-CM | POA: Diagnosis not present

## 2021-06-13 ENCOUNTER — Other Ambulatory Visit: Payer: Self-pay

## 2021-06-13 ENCOUNTER — Encounter (HOSPITAL_BASED_OUTPATIENT_CLINIC_OR_DEPARTMENT_OTHER): Payer: Self-pay | Admitting: Orthopedic Surgery

## 2021-06-21 ENCOUNTER — Encounter (HOSPITAL_BASED_OUTPATIENT_CLINIC_OR_DEPARTMENT_OTHER)
Admission: RE | Admit: 2021-06-21 | Discharge: 2021-06-21 | Disposition: A | Discharge status: Home / Self Care | Payer: Medicare Other | Source: Ambulatory Visit | Attending: Orthopedic Surgery | Admitting: Orthopedic Surgery

## 2021-06-21 DIAGNOSIS — I10 Essential (primary) hypertension: Secondary | ICD-10-CM | POA: Diagnosis not present

## 2021-06-21 DIAGNOSIS — Z79899 Other long term (current) drug therapy: Secondary | ICD-10-CM | POA: Diagnosis not present

## 2021-06-21 DIAGNOSIS — H409 Unspecified glaucoma: Secondary | ICD-10-CM | POA: Diagnosis not present

## 2021-06-21 DIAGNOSIS — G5601 Carpal tunnel syndrome, right upper limb: Secondary | ICD-10-CM | POA: Diagnosis not present

## 2021-06-21 NOTE — Progress Notes (Signed)

## 2021-06-24 ENCOUNTER — Ambulatory Visit (HOSPITAL_BASED_OUTPATIENT_CLINIC_OR_DEPARTMENT_OTHER)
Admission: RE | Admit: 2021-06-24 | Discharge: 2021-06-24 | Disposition: A | Discharge status: Home / Self Care | Payer: Medicare Other | Source: Ambulatory Visit | Attending: Orthopedic Surgery | Admitting: Orthopedic Surgery

## 2021-06-24 ENCOUNTER — Encounter (HOSPITAL_BASED_OUTPATIENT_CLINIC_OR_DEPARTMENT_OTHER): Payer: Self-pay | Admitting: Orthopedic Surgery

## 2021-06-24 ENCOUNTER — Encounter (HOSPITAL_BASED_OUTPATIENT_CLINIC_OR_DEPARTMENT_OTHER)
Admission: RE | Disposition: A | Discharge status: Home / Self Care | Payer: Self-pay | Source: Ambulatory Visit | Attending: Orthopedic Surgery

## 2021-06-24 ENCOUNTER — Ambulatory Visit (HOSPITAL_BASED_OUTPATIENT_CLINIC_OR_DEPARTMENT_OTHER): Payer: Medicare Other | Admitting: Anesthesiology

## 2021-06-24 ENCOUNTER — Other Ambulatory Visit: Payer: Self-pay

## 2021-06-24 DIAGNOSIS — I10 Essential (primary) hypertension: Secondary | ICD-10-CM | POA: Diagnosis not present

## 2021-06-24 DIAGNOSIS — Z79899 Other long term (current) drug therapy: Secondary | ICD-10-CM | POA: Insufficient documentation

## 2021-06-24 DIAGNOSIS — H409 Unspecified glaucoma: Secondary | ICD-10-CM | POA: Diagnosis not present

## 2021-06-24 DIAGNOSIS — G5601 Carpal tunnel syndrome, right upper limb: Secondary | ICD-10-CM

## 2021-06-24 HISTORY — PX: CARPAL TUNNEL RELEASE: SHX101

## 2021-06-24 HISTORY — DX: Unspecified glaucoma: H40.9

## 2021-06-24 HISTORY — DX: Essential (primary) hypertension: I10

## 2021-06-24 SURGERY — CARPAL TUNNEL RELEASE
Anesthesia: General | Site: Hand | Laterality: Right

## 2021-06-24 MED ORDER — FENTANYL CITRATE (PF) 100 MCG/2ML IJ SOLN: 25.0000 ug | INTRAMUSCULAR | Status: DC | PRN

## 2021-06-24 MED ORDER — ONDANSETRON HCL 4 MG/2ML IJ SOLN
INTRAMUSCULAR | Status: AC
  Filled 2021-06-24: qty 2

## 2021-06-24 MED ORDER — MIDAZOLAM HCL 2 MG/2ML IJ SOLN
INTRAMUSCULAR | Status: AC
  Filled 2021-06-24: qty 2

## 2021-06-24 MED ORDER — LIDOCAINE HCL (CARDIAC) PF 100 MG/5ML IV SOSY
PREFILLED_SYRINGE | INTRAVENOUS | Status: DC | PRN
  Administered 2021-06-24: 100 mg via INTRAVENOUS

## 2021-06-24 MED ORDER — ACETAMINOPHEN 500 MG PO TABS
1000.0000 mg | ORAL_TABLET | Freq: Once | ORAL | Status: AC
Start: 2021-06-24 — End: 2021-06-24
  Administered 2021-06-24: 1000 mg via ORAL

## 2021-06-24 MED ORDER — ACETAMINOPHEN 500 MG PO TABS
ORAL_TABLET | ORAL | Status: AC
  Filled 2021-06-24: qty 2

## 2021-06-24 MED ORDER — OXYCODONE HCL 5 MG PO TABS: 5.0000 mg | ORAL_TABLET | Freq: Once | ORAL | Status: DC | PRN

## 2021-06-24 MED ORDER — DEXAMETHASONE SODIUM PHOSPHATE 10 MG/ML IJ SOLN
INTRAMUSCULAR | Status: DC | PRN
  Administered 2021-06-24: 5 mg via INTRAVENOUS

## 2021-06-24 MED ORDER — FENTANYL CITRATE (PF) 100 MCG/2ML IJ SOLN
INTRAMUSCULAR | Status: DC | PRN
  Administered 2021-06-24: 100 ug via INTRAVENOUS

## 2021-06-24 MED ORDER — ONDANSETRON HCL 4 MG/2ML IJ SOLN
INTRAMUSCULAR | Status: DC | PRN
  Administered 2021-06-24: 4 mg via INTRAVENOUS

## 2021-06-24 MED ORDER — PROPOFOL 10 MG/ML IV BOLUS
INTRAVENOUS | Status: DC | PRN
  Administered 2021-06-24: 200 mg via INTRAVENOUS

## 2021-06-24 MED ORDER — CEFAZOLIN SODIUM-DEXTROSE 2-4 GM/100ML-% IV SOLN
2.0000 g | INTRAVENOUS | Status: AC
  Administered 2021-06-24: 2 g via INTRAVENOUS

## 2021-06-24 MED ORDER — LACTATED RINGERS IV SOLN: INTRAVENOUS | Status: DC

## 2021-06-24 MED ORDER — CEFAZOLIN SODIUM-DEXTROSE 2-4 GM/100ML-% IV SOLN
INTRAVENOUS | Status: AC
Start: 2021-06-24 — End: ?
  Filled 2021-06-24: qty 100

## 2021-06-24 MED ORDER — TRAMADOL HCL 50 MG PO TABS
ORAL_TABLET | ORAL | 0 refills | Status: DC
Start: 2021-06-24 — End: 2023-03-04

## 2021-06-24 MED ORDER — MIDAZOLAM HCL 5 MG/5ML IJ SOLN
INTRAMUSCULAR | Status: DC | PRN
Start: 2021-06-24 — End: 2021-06-24
  Administered 2021-06-24: 2 mg via INTRAVENOUS

## 2021-06-24 MED ORDER — EPHEDRINE 5 MG/ML INJ
INTRAVENOUS | Status: AC
Start: 2021-06-24 — End: ?
  Filled 2021-06-24: qty 5

## 2021-06-24 MED ORDER — LIDOCAINE 2% (20 MG/ML) 5 ML SYRINGE
INTRAMUSCULAR | Status: AC
  Filled 2021-06-24: qty 5

## 2021-06-24 MED ORDER — BUPIVACAINE HCL (PF) 0.25 % IJ SOLN
INTRAMUSCULAR | Status: DC | PRN
  Administered 2021-06-24: 9 mL

## 2021-06-24 MED ORDER — 0.9 % SODIUM CHLORIDE (POUR BTL) OPTIME
TOPICAL | Status: DC | PRN
  Administered 2021-06-24: 75 mL

## 2021-06-24 MED ORDER — FENTANYL CITRATE (PF) 100 MCG/2ML IJ SOLN
INTRAMUSCULAR | Status: AC
  Filled 2021-06-24: qty 2

## 2021-06-24 MED ORDER — OXYCODONE HCL 5 MG/5ML PO SOLN: 5.0000 mg | Freq: Once | ORAL | Status: DC | PRN

## 2021-06-24 SURGICAL SUPPLY — 38 items
APL PRP STRL LF DISP 70% ISPRP (MISCELLANEOUS) ×1
BLADE SURG 15 STRL LF DISP TIS (BLADE) ×2 IMPLANT
BLADE SURG 15 STRL SS (BLADE) ×4
BNDG CMPR 9X4 STRL LF SNTH (GAUZE/BANDAGES/DRESSINGS) ×1
BNDG ELASTIC 3X5.8 VLCR STR LF (GAUZE/BANDAGES/DRESSINGS) ×2 IMPLANT
BNDG ESMARK 4X9 LF (GAUZE/BANDAGES/DRESSINGS) ×1 IMPLANT
BNDG GAUZE ELAST 4 BULKY (GAUZE/BANDAGES/DRESSINGS) ×2 IMPLANT
CHLORAPREP W/TINT 26 (MISCELLANEOUS) ×2 IMPLANT
CORD BIPOLAR FORCEPS 12FT (ELECTRODE) ×2 IMPLANT
COVER BACK TABLE 60X90IN (DRAPES) ×2 IMPLANT
COVER MAYO STAND STRL (DRAPES) ×2 IMPLANT
CUFF TOURN SGL QUICK 18X4 (TOURNIQUET CUFF) ×1 IMPLANT
DRAPE EXTREMITY T 121X128X90 (DISPOSABLE) ×2 IMPLANT
DRAPE SURG 17X23 STRL (DRAPES) ×1 IMPLANT
DRSG PAD ABDOMINAL 8X10 ST (GAUZE/BANDAGES/DRESSINGS) ×2 IMPLANT
GAUZE SPONGE 4X4 12PLY STRL (GAUZE/BANDAGES/DRESSINGS) ×2 IMPLANT
GAUZE XEROFORM 1X8 LF (GAUZE/BANDAGES/DRESSINGS) ×2 IMPLANT
GLOVE SRG 8 PF TXTR STRL LF DI (GLOVE) ×1 IMPLANT
GLOVE SURG ENC MOIS LTX SZ7.5 (GLOVE) ×2 IMPLANT
GLOVE SURG POLYISO LF SZ6.5 (GLOVE) ×1 IMPLANT
GLOVE SURG UNDER POLY LF SZ6.5 (GLOVE) ×2 IMPLANT
GLOVE SURG UNDER POLY LF SZ8 (GLOVE) ×2
GOWN STRL REUS W/ TWL LRG LVL3 (GOWN DISPOSABLE) ×1 IMPLANT
GOWN STRL REUS W/ TWL XL LVL3 (GOWN DISPOSABLE) IMPLANT
GOWN STRL REUS W/TWL LRG LVL3 (GOWN DISPOSABLE)
GOWN STRL REUS W/TWL XL LVL3 (GOWN DISPOSABLE) ×4 IMPLANT
NDL HYPO 25X1 1.5 SAFETY (NEEDLE) ×1 IMPLANT
NEEDLE HYPO 25X1 1.5 SAFETY (NEEDLE) ×2 IMPLANT
NS IRRIG 1000ML POUR BTL (IV SOLUTION) ×2 IMPLANT
PACK BASIN DAY SURGERY FS (CUSTOM PROCEDURE TRAY) ×2 IMPLANT
PADDING CAST ABS 4INX4YD NS (CAST SUPPLIES)
PADDING CAST ABS COTTON 4X4 ST (CAST SUPPLIES) ×1 IMPLANT
STOCKINETTE 4X48 STRL (DRAPES) ×2 IMPLANT
SUT ETHILON 4 0 PS 2 18 (SUTURE) ×2 IMPLANT
SYR BULB EAR ULCER 3OZ GRN STR (SYRINGE) ×2 IMPLANT
SYR CONTROL 10ML LL (SYRINGE) ×2 IMPLANT
TOWEL GREEN STERILE FF (TOWEL DISPOSABLE) ×3 IMPLANT
UNDERPAD 30X36 HEAVY ABSORB (UNDERPADS AND DIAPERS) ×2 IMPLANT

## 2021-06-24 NOTE — Anesthesia Preprocedure Evaluation (Addendum)
Anesthesia Evaluation  ?Patient identified by MRN, date of birth, ID band ?Patient awake ? ? ? ?Reviewed: ?Allergy & Precautions, NPO status , Patient's Chart, lab work & pertinent test results ? ?History of Anesthesia Complications ?(+) PONV and history of anesthetic complications ? ?Airway ?Mallampati: II ? ?TM Distance: >3 FB ?Neck ROM: Full ? ? ? Dental ?no notable dental hx. ?(+) Teeth Intact, Caps, Dental Advisory Given ?  ?Pulmonary ?neg pulmonary ROS,  ?  ?Pulmonary exam normal ?breath sounds clear to auscultation ? ? ? ? ? ? Cardiovascular ?hypertension, Pt. on medications ?negative cardio ROS ?Normal cardiovascular exam ?Rhythm:Regular Rate:Normal ? ? ?  ?Neuro/Psych ?negative neurological ROS ? negative psych ROS  ? GI/Hepatic ?negative GI ROS, Neg liver ROS,   ?Endo/Other  ?negative endocrine ROS ? Renal/GU ?negative Renal ROS  ?negative genitourinary ?  ?Musculoskeletal ?negative musculoskeletal ROS ?(+)  ? Abdominal ?  ?Peds ?negative pediatric ROS ?(+)  Hematology ?negative hematology ROS ?(+)   ?Anesthesia Other Findings ?Glaucoma ? Reproductive/Obstetrics ?negative OB ROS ? ?  ? ? ? ? ? ? ? ? ? ? ? ? ? ?  ?  ? ? ? ? ? ? ?Anesthesia Physical ?Anesthesia Plan ? ?ASA: 2 ? ?Anesthesia Plan: General  ? ?Post-op Pain Management: Tylenol PO (pre-op)*  ? ?Induction: Intravenous ? ?PONV Risk Score and Plan: 2 and Treatment may vary due to age or medical condition, Ondansetron and Dexamethasone ? ?Airway Management Planned: LMA and Oral ETT ? ?Additional Equipment: None ? ?Intra-op Plan:  ? ?Post-operative Plan: Extubation in OR ? ?Informed Consent:  ? ?Plan Discussed with: CRNA and Anesthesiologist ? ?Anesthesia Plan Comments: ( )  ? ? ? ? ? ?Anesthesia Quick Evaluation ? ?

## 2021-06-24 NOTE — H&P (Signed)
?  Darren Ponce is an 71 y.o. male.   ?Chief Complaint: carpal tunnel syndrome ?HPI: 71 yo male with numbness and tingling in right hand.  Nocturnal symptoms.  Positive nerve conduction studies.  He wishes to have right carpal tunnel release. ? ?Allergies:  ?Allergies  ?Allergen Reactions  ? Codeine   ?  vomiting  ? ? ?Past Medical History:  ?Diagnosis Date  ? Choledocholithiasis 09/02/2012  ? Glaucoma   ? Hypertension   ? Kidney stone   ? PONV (postoperative nausea and vomiting)   ? ? ?Past Surgical History:  ?Procedure Laterality Date  ? CHOLECYSTECTOMY N/A 09/04/2012  ? Procedure: LAPAROSCOPIC CHOLECYSTECTOMY WITH INTRAOPERATIVE CHOLANGIOGRAM;  Surgeon: Shann Medal, MD;  Location: [facility];  Service: General;  Laterality: N/A;  ? ERCP N/A 09/03/2012  ? Procedure: ENDOSCOPIC RETROGRADE CHOLANGIOPANCREATOGRAPHY (ERCP);  Surgeon: Gatha Mayer, MD;  Location: [facility];  Service: Endoscopy;  Laterality: N/A;  ? HERNIA REPAIR    ? ? ?Family History: ?History reviewed. No pertinent family history. ? ?Social History:  ? reports that he has never smoked. He has never used smokeless tobacco. He reports current alcohol use. He reports that he does not use drugs. ? ?Medications: ?Medications Prior to Admission  ?Medication Sig Dispense Refill  ? acetaminophen (TYLENOL) 500 MG tablet Take 1,000 mg by mouth every 6 (six) hours as needed for pain.    ? latanoprost (XALATAN) 0.005 % ophthalmic solution Place 1 drop into the left eye daily before breakfast.    ? lisinopril (ZESTRIL) 5 MG tablet Take 1 tablet (5 mg total) by mouth at bedtime. 30 tablet 0  ? ? ?No results found for this or any previous visit (from the past 48 hour(s)). ? ?No results found. ? ? ? ?Height '5\' 10"'$  (1.778 m), weight 86.2 kg. ? ?General appearance: alert, cooperative, and appears stated age ?Head: Normocephalic, without obvious abnormality, atraumatic ?Neck: supple, symmetrical, trachea midline ?Extremities: Intact capillary refill all digits.   +epl/fpl/io.  No wounds.  ?Pulses: 2+ and symmetric ?Skin: Skin color, texture, turgor normal. No rashes or lesions ?Neurologic: Grossly normal ?Incision/Wound: none ? ?Assessment/Plan ?Right carpal tunnel syndrome.  Non operative and operative treatment options have been discussed with the patient and patient wishes to proceed with operative treatment. Risks, benefits, and alternatives of surgery have been discussed and the patient agrees with the plan of care.  ? ?Leanora Cover ?06/24/2021, 12:05 PM ? ?

## 2021-06-24 NOTE — Op Note (Signed)
06/24/2021 ?[place] ? ?                            OPERATIVE REPORT ? ? ?PREOPERATIVE DIAGNOSIS:  Right carpal tunnel syndrome. ? ?POSTOPERATIVE DIAGNOSIS:  Right carpal tunnel syndrome. ? ?PROCEDURE:  Right carpal tunnel release. ? ?SURGEON:  Leanora Cover, MD ? ?ASSISTANT:  none. ? ?ANESTHESIA: General ? ?IV FLUIDS:  Per anesthesia flow sheet. ? ?ESTIMATED BLOOD LOSS:  Minimal. ? ?COMPLICATIONS:  None. ? ?SPECIMENS:  None. ? ?TOURNIQUET TIME:   ? ?Total Tourniquet Time Documented: ?Upper Arm (Right) - 14 minutes ?Total: Upper Arm (Right) - 14 minutes ? ? ?DISPOSITION:  Stable to PACU. ? ?LOCATION: [place] ? ?INDICATIONS:  71 yo male with numbness and tingling right hand.  Positive nerve conduction studies.  He wishes to have a carpal tunnel release for management of his symptoms.  Risks, benefits and alternatives of surgery were discussed including the risk of blood loss; infection; damage to nerves, vessels, tendons, ligaments, bone; failure of surgery; need for additional surgery; complications with wound healing; continued pain; recurrence of carpal tunnel syndrome; and damage to motor branch. He voiced understanding of these risks and elected to proceed.  ? ?OPERATIVE COURSE:  After being identified preoperatively by myself, the patient and I agreed upon the procedure and site of procedure.  The surgical site was marked.  Surgical consent had been signed.  He was given IV Ancef as preoperative antibiotic prophylaxis.  He was transferred to the operating room and placed on the operating room table in supine position with the Right upper extremity on an armboard.  General anesthesia was induced by the anesthesiologist.  Right upper extremity was prepped and draped in normal sterile orthopaedic fashion.  A surgical pause was performed between the surgeons, anesthesia, and operating room staff, and all were in agreement as to the patient, procedure, and site of procedure.   Tourniquet at the proximal aspect of the extremity was inflated to 250 mmHg after exsanguination of the arm with an Esmarch bandage  Incision was made over the transverse carpal ligament and carried into the subcutaneous tissues by spreading technique.  Bipolar electrocautery was used to obtain hemostasis.  The palmar fascia was sharply incised.  The transverse carpal ligament was identified and sharply incised.  It was incised distally first.  The flexor tendons were identified.  The flexor tendon to the ring finger was identified and retracted radially.  The transverse carpal ligament was then incised proximally.  Scissors were used to split the distal aspect of the volar antebrachial fascia.  A finger was placed into the wound to ensure complete decompression, which was the case.  The nerve was examined.  It was flattened and hyperemic.  The motor branch was identified and was intact.  The wound was copiously irrigated with sterile saline.  It was then closed with 4-0 nylon in a horizontal mattress fashion.  It was injected with 0.25% plain Marcaine to aid in postoperative analgesia.  It was dressed with sterile Xeroform, 4x4s, an ABD, and wrapped with Kerlix and an Ace bandage.  Tourniquet was deflated at 14 minutes.  Fingertips were pink with brisk capillary refill after deflation of the tourniquet.  Operative drapes were broken down.  The patient was awoken from anesthesia safely.  He was transferred back to stretcher and taken to the PACU in stable condition.  I will see him back in the office  in 1 week for postoperative followup.  I will give him a prescription for Tramadol 50 mg 1 tab PO q6 hours prn pain, dispense # 20. ? ? ? ?Leanora Cover, MD ?Electronically signed, 06/24/21 ? ?

## 2021-06-24 NOTE — Anesthesia Postprocedure Evaluation (Signed)
Anesthesia Post Note ? ?Patient: Darren Ponce ? ?Procedure(s) Performed: RIGHT CARPAL TUNNEL RELEASE (Right: Hand) ? ?  ? ?Patient location during evaluation: PACU ?Anesthesia Type: General ?Level of consciousness: awake and alert ?Pain management: pain level controlled ?Vital Signs Assessment: post-procedure vital signs reviewed and stable ?Respiratory status: spontaneous breathing, nonlabored ventilation, respiratory function stable and patient connected to nasal cannula oxygen ?Cardiovascular status: blood pressure returned to baseline and stable ?Postop Assessment: no apparent nausea or vomiting ?Anesthetic complications: no ? ? ?No notable events documented. ? ?Last Vitals:  ?Vitals:  ? 06/24/21 1434 06/24/21 1445  ?BP:  136/70  ?Pulse: 85 71  ?Resp: 16 19  ?Temp: (!) 36.4 ?C   ?SpO2: 94% 97%  ?  ?Last Pain:  ?Vitals:  ? 06/24/21 1445  ?TempSrc:   ?PainSc: 0-No pain  ? ? ?  ?  ?  ?  ?  ?  ? ?Darren Ponce ? ? ? ? ?

## 2021-06-24 NOTE — Transfer of Care (Signed)
Immediate Anesthesia Transfer of Care Note ? ?Patient: Darren Ponce ? ?Procedure(s) Performed: RIGHT CARPAL TUNNEL RELEASE (Right: Hand) ? ?Patient Location: PACU ? ?Anesthesia Type:General ? ?Level of Consciousness: awake ? ?Airway & Oxygen Therapy: Patient Spontanous Breathing and Patient connected to face mask oxygen ? ?Post-op Assessment: Report given to RN and Post -op Vital signs reviewed and stable ? ?Post vital signs: Reviewed and stable ? ?Last Vitals:  ?Vitals Value Taken Time  ?BP    ?Temp    ?Pulse 85 06/24/21 1433  ?Resp 16 06/24/21 1433  ?SpO2 94 % 06/24/21 1433  ?Vitals shown include unvalidated device data. ? ?Last Pain:  ?Vitals:  ? 06/24/21 1206  ?TempSrc: Oral  ?PainSc: 0-No pain  ?   ? ?Patients Stated Pain Goal: 3 (06/24/21 1206) ? ?Complications: No notable events documented. ?

## 2021-06-24 NOTE — Anesthesia Procedure Notes (Signed)
Procedure Name: LMA Insertion ?Date/Time: 06/24/2021 2:06 PM ?Performed by: Tawni Millers, CRNA ?Pre-anesthesia Checklist: Patient identified, Emergency Drugs available, Suction available and Patient being monitored ?Patient Re-evaluated:Patient Re-evaluated prior to induction ?Oxygen Delivery Method: Circle system utilized ?Preoxygenation: Pre-oxygenation with 100% oxygen ?Induction Type: IV induction ?Ventilation: Mask ventilation without difficulty ?LMA: LMA inserted ?LMA Size: 4.0 ?Number of attempts: 1 ?Airway Equipment and Method: Bite block ?Placement Confirmation: positive ETCO2 ?Tube secured with: Tape ?Dental Injury: Teeth and Oropharynx as per pre-operative assessment  ? ? ? ? ?

## 2021-06-24 NOTE — Discharge Instructions (Addendum)
[facility] Instructions ?Hand Surgery ? ?Wound Care: ?Keep your hand elevated above the level of your heart.  Do not allow it to dangle by your side.  Keep the dressing dry and do not remove it unless your doctor advises you to do so.  He will usually change it at the time of your post-op visit.  Moving your fingers is advised to stimulate circulation but will depend on the site of your surgery.  If you have a splint applied, your doctor will advise you regarding movement. ? ?Activity: ?Do not drive or operate machinery today.  Rest today and then you may return to your normal activity and work as indicated by your physician. ? ?Diet:  ?Drink liquids today or eat a light diet.  You may resume a regular diet tomorrow.   ? ?General expectations: ?Pain for two to three days. ?Fingers may become slightly swollen. ? ?Call your doctor if any of the following occur: ?Severe pain not relieved by pain medication. ?Elevated temperature. ?Dressing soaked with blood. ?Inability to move fingers. ?White or bluish color to fingers. ? ? ?Post Anesthesia Home Care Instructions ? ?Activity: ?Get plenty of rest for the remainder of the day. A responsible individual must stay with you for 24 hours following the procedure.  ?For the next 24 hours, DO NOT: ?-Drive a car ?-Paediatric nurse ?-Drink alcoholic beverages ?-Take any medication unless instructed by your physician ?-Make any legal decisions or sign important papers. ? ?Meals: ?Start with liquid foods such as gelatin or soup. Progress to regular foods as tolerated. Avoid greasy, spicy, heavy foods. If nausea and/or vomiting occur, drink only clear liquids until the nausea and/or vomiting subsides. Call your physician if vomiting continues. ? ?Special Instructions/Symptoms: ?Your throat may feel dry or sore from the anesthesia or the breathing tube placed in your throat during surgery. If this causes discomfort, gargle with warm salt water. The discomfort should disappear within  24 hours. ? ?If you had a scopolamine patch placed behind your ear for the management of post- operative nausea and/or vomiting: ? ?1. The medication in the patch is effective for 72 hours, after which it should be removed.  Wrap patch in a tissue and discard in the trash. Wash hands thoroughly with soap and water. ?2. You may remove the patch earlier than 72 hours if you experience unpleasant side effects which may include dry mouth, dizziness or visual disturbances. ?3. Avoid touching the patch. Wash your hands with soap and water after contact with the patch. ? ?No tylenol today until after 5pm if needed. ?    ?

## 2021-06-25 ENCOUNTER — Encounter (HOSPITAL_BASED_OUTPATIENT_CLINIC_OR_DEPARTMENT_OTHER): Payer: Self-pay | Admitting: Orthopedic Surgery

## 2021-07-30 DIAGNOSIS — L821 Other seborrheic keratosis: Secondary | ICD-10-CM | POA: Diagnosis not present

## 2021-07-30 DIAGNOSIS — L814 Other melanin hyperpigmentation: Secondary | ICD-10-CM | POA: Diagnosis not present

## 2021-07-30 DIAGNOSIS — D225 Melanocytic nevi of trunk: Secondary | ICD-10-CM | POA: Diagnosis not present

## 2021-07-30 DIAGNOSIS — L304 Erythema intertrigo: Secondary | ICD-10-CM | POA: Diagnosis not present

## 2021-08-21 DIAGNOSIS — I6529 Occlusion and stenosis of unspecified carotid artery: Secondary | ICD-10-CM | POA: Diagnosis not present

## 2021-08-21 DIAGNOSIS — I129 Hypertensive chronic kidney disease with stage 1 through stage 4 chronic kidney disease, or unspecified chronic kidney disease: Secondary | ICD-10-CM | POA: Diagnosis not present

## 2021-08-21 DIAGNOSIS — I700 Atherosclerosis of aorta: Secondary | ICD-10-CM | POA: Diagnosis not present

## 2021-08-21 DIAGNOSIS — N1831 Chronic kidney disease, stage 3a: Secondary | ICD-10-CM | POA: Diagnosis not present

## 2021-08-27 ENCOUNTER — Other Ambulatory Visit: Payer: Self-pay | Admitting: Family Medicine

## 2021-08-27 DIAGNOSIS — I6529 Occlusion and stenosis of unspecified carotid artery: Secondary | ICD-10-CM

## 2021-08-29 DIAGNOSIS — H401121 Primary open-angle glaucoma, left eye, mild stage: Secondary | ICD-10-CM | POA: Diagnosis not present

## 2021-09-03 DIAGNOSIS — C44519 Basal cell carcinoma of skin of other part of trunk: Secondary | ICD-10-CM | POA: Diagnosis not present

## 2021-09-03 DIAGNOSIS — D044 Carcinoma in situ of skin of scalp and neck: Secondary | ICD-10-CM | POA: Diagnosis not present

## 2021-09-03 DIAGNOSIS — C4442 Squamous cell carcinoma of skin of scalp and neck: Secondary | ICD-10-CM | POA: Diagnosis not present

## 2021-09-04 ENCOUNTER — Ambulatory Visit
Admission: RE | Admit: 2021-09-04 | Discharge: 2021-09-04 | Disposition: A | Discharge status: Home / Self Care | Payer: Medicare Other | Source: Ambulatory Visit | Attending: Family Medicine | Admitting: Family Medicine

## 2021-09-04 DIAGNOSIS — I6523 Occlusion and stenosis of bilateral carotid arteries: Secondary | ICD-10-CM | POA: Diagnosis not present

## 2021-09-04 DIAGNOSIS — I6529 Occlusion and stenosis of unspecified carotid artery: Secondary | ICD-10-CM

## 2021-09-20 DIAGNOSIS — R1032 Left lower quadrant pain: Secondary | ICD-10-CM | POA: Diagnosis not present

## 2021-10-16 DIAGNOSIS — Z08 Encounter for follow-up examination after completed treatment for malignant neoplasm: Secondary | ICD-10-CM | POA: Diagnosis not present

## 2021-10-16 DIAGNOSIS — D492 Neoplasm of unspecified behavior of bone, soft tissue, and skin: Secondary | ICD-10-CM | POA: Diagnosis not present

## 2021-10-16 DIAGNOSIS — D225 Melanocytic nevi of trunk: Secondary | ICD-10-CM | POA: Diagnosis not present

## 2021-10-16 DIAGNOSIS — L538 Other specified erythematous conditions: Secondary | ICD-10-CM | POA: Diagnosis not present

## 2021-10-16 DIAGNOSIS — Z85828 Personal history of other malignant neoplasm of skin: Secondary | ICD-10-CM | POA: Diagnosis not present

## 2021-10-16 DIAGNOSIS — L821 Other seborrheic keratosis: Secondary | ICD-10-CM | POA: Diagnosis not present

## 2021-10-16 DIAGNOSIS — L304 Erythema intertrigo: Secondary | ICD-10-CM | POA: Diagnosis not present

## 2021-10-16 DIAGNOSIS — L814 Other melanin hyperpigmentation: Secondary | ICD-10-CM | POA: Diagnosis not present

## 2021-10-16 DIAGNOSIS — C44622 Squamous cell carcinoma of skin of right upper limb, including shoulder: Secondary | ICD-10-CM | POA: Diagnosis not present

## 2021-11-04 ENCOUNTER — Encounter (HOSPITAL_COMMUNITY): Payer: Self-pay

## 2021-11-04 ENCOUNTER — Other Ambulatory Visit: Payer: Self-pay

## 2021-11-04 ENCOUNTER — Emergency Department (HOSPITAL_COMMUNITY): Payer: Medicare Other

## 2021-11-04 ENCOUNTER — Emergency Department (HOSPITAL_COMMUNITY)
Admission: EM | Admit: 2021-11-04 | Discharge: 2021-11-04 | Disposition: A | Discharge status: Home / Self Care | Payer: Medicare Other | Attending: Emergency Medicine | Admitting: Emergency Medicine

## 2021-11-04 DIAGNOSIS — S0990XA Unspecified injury of head, initial encounter: Secondary | ICD-10-CM | POA: Diagnosis not present

## 2021-11-04 DIAGNOSIS — Z043 Encounter for examination and observation following other accident: Secondary | ICD-10-CM | POA: Diagnosis not present

## 2021-11-04 DIAGNOSIS — W2100XA Struck by hit or thrown ball, unspecified type, initial encounter: Secondary | ICD-10-CM | POA: Insufficient documentation

## 2021-11-04 NOTE — Discharge Instructions (Signed)
Get help right away if: ?You have: ?A severe headache that is not helped by medicine. ?Trouble walking or weakness in your arms and legs. ?Clear or bloody fluid coming from your nose or ears. ?Changes in your vision. ?A seizure. ?Increased confusion or irritability. ?Your symptoms get worse. ?You are sleepier than normal and have trouble staying awake. ?You lose your balance. ?Your pupils change size. ?Your speech is slurred. ?Your dizziness gets worse. ?You vomit. ?

## 2021-11-04 NOTE — ED Notes (Signed)
I provided reinforced discharge education based off of after visit summary/care provided. Pt acknowledged and understood my education. Pt had no further questions/concerns for provider/myself. After visit summary provided to pt. 

## 2021-11-04 NOTE — ED Triage Notes (Signed)
Pt fell and hit the back of his head while playing pickle ball today. No LOC.

## 2021-11-04 NOTE — ED Provider Notes (Signed)
[facility] DEPT Provider Note   CSN: [id number] Arrival date & time: 11/04/21  2030     History  Chief Complaint  Patient presents with   [name]    Darren Ponce is a 71 y.o. male.  The history is provided by the patient and a relative. No language interpreter was used.  Head Injury Location:  Occipital Time since incident:  4 hours Mechanism of injury: fall   Fall:    Fall occurred:  Standing   Impact surface:  Chief Technology Officer of impact:  Head Pain details:    Quality:  Aching   Severity:  Moderate   Timing:  Constant   Progression:  Improving Chronicity:  New Relieved by:  Nothing Worsened by:  Nothing Associated symptoms: disorientation (brief) and headache   Associated symptoms: no blurred vision, no difficulty breathing, no double vision, no focal weakness, no hearing loss, no loss of consciousness, no memory loss, no nausea, no neck pain, no numbness, no seizures, no tinnitus and no vomiting   Risk factors: being elderly        Home Medications Prior to Admission medications   Medication Sig Start Date End Date Taking? Authorizing Provider  acetaminophen (TYLENOL) 500 MG tablet Take 1,000 mg by mouth every 6 (six) hours as needed for pain.    [provider]  latanoprost (XALATAN) 0.005 % ophthalmic solution Place 1 drop into the left eye daily before breakfast. 04/29/19   [provider]  lisinopril (ZESTRIL) 5 MG tablet Take 1 tablet (5 mg total) by mouth at bedtime. 05/13/19   Petrucelli, Glynda Jaeger, PA-C  traMADol Veatrice Bourbon) 50 MG tablet 1-2 tabs PO q6 hours prn pain 06/24/21   Leanora Cover, MD      Allergies    Codeine    Review of Systems   Review of Systems  HENT:  Negative for hearing loss and tinnitus.   Eyes:  Negative for blurred vision and double vision.  Gastrointestinal:  Negative for nausea and vomiting.  Musculoskeletal:  Negative for neck pain.  Neurological:  Positive for headaches.  Negative for focal weakness, seizures, loss of consciousness and numbness.  Psychiatric/Behavioral:  Negative for memory loss.     Physical Exam Updated Vital Signs BP (!) 159/90 (BP Location: Left Arm)   Pulse 68   Temp 98.2 F (36.8 C) (Oral)   Resp 18   Ht '5\' 10"'$  (1.778 m)   Wt 86.2 kg   SpO2 97%   BMI 27.26 kg/m  Physical Exam  Physical Exam  Constitutional: Pt is oriented to person, place, and time. Pt appears well-developed and well-nourished. No distress.  HENT:  Head: Normocephalic 4 cm occipital hematoma  Mouth/Throat: Oropharynx is clear and moist.  Eyes: Conjunctivae and EOM are normal. Pupils are equal, round, and reactive to light. No scleral icterus.  No horizontal, vertical or rotational nystagmus  Neck: Normal range of motion. Neck supple.  Full active and passive ROM without pain No midline or paraspinal tenderness No nuchal rigidity or meningeal signs  Cardiovascular: Normal rate, regular rhythm and intact distal pulses.   Pulmonary/Chest: Effort normal and breath sounds normal. No respiratory distress. Pt has no wheezes. No rales.  Abdominal: Soft. Bowel sounds are normal. There is no tenderness. There is no rebound and no guarding.  Musculoskeletal: Normal range of motion.  Lymphadenopathy:    No cervical adenopathy.  Neurological: Pt. is alert and oriented to person, place, and time. He has normal reflexes.  No cranial nerve deficit.  Exhibits normal muscle tone. Coordination normal.  Mental Status:  Alert, oriented, thought content appropriate. Speech fluent without evidence of aphasia. Able to follow 2 step commands without difficulty.  Cranial Nerves:  II:  Peripheral visual fields grossly normal, pupils equal, round, reactive to light III,IV, VI: ptosis not present, extra-ocular motions intact bilaterally  V,VII: smile symmetric, facial light touch sensation equal VIII: hearing grossly normal bilaterally  IX,X: midline uvula rise  XI: bilateral  shoulder shrug equal and strong XII: midline tongue extension  Motor:  5/5 in upper and lower extremities bilaterally including strong and equal grip strength and dorsiflexion/plantar flexion Sensory: Pinprick and light touch normal in all extremities.  Deep Tendon Reflexes: 2+ and symmetric  Cerebellar: normal finger-to-nose with bilateral upper extremities Gait: normal gait and balance CV: distal pulses palpable throughout   Skin: Skin is warm and dry. No rash noted. Pt is not diaphoretic.  Psychiatric: Pt has a normal mood and affect. Behavior is normal. Judgment and thought content normal.  Nursing note and vitals reviewed.  ED Results / Procedures / Treatments   Labs (all labs ordered are listed, but only abnormal results are displayed) Labs Reviewed - No data to display  EKG None  Radiology CT Head Wo Contrast  Result Date: 11/04/2021 CLINICAL DATA:  Status post fall. EXAM: CT HEAD WITHOUT CONTRAST TECHNIQUE: Contiguous axial images were obtained from the base of the skull through the vertex without intravenous contrast. RADIATION DOSE REDUCTION: This exam was performed according to the departmental dose-optimization program which includes automated exposure control, adjustment of the mA and/or kV according to patient size and/or use of iterative reconstruction technique. COMPARISON:  None Available. FINDINGS: Brain: There is mild cerebral atrophy with widening of the extra-axial spaces and ventricular dilatation. There are areas of decreased attenuation within the white matter tracts of the supratentorial brain, consistent with microvascular disease changes. Vascular: No hyperdense vessel or unexpected calcification. Skull: Normal. Negative for fracture or focal lesion. Sinuses/Orbits: No acute finding. Other: None. IMPRESSION: 1. No acute intracranial abnormality. 2. Generalized cerebral atrophy with chronic white matter small vessel ischemic changes. Electronically Signed   By:  Virgina Norfolk M.D.   On: 11/04/2021 22:24    Procedures Procedures    Medications Ordered in ED Medications - No data to display  ED Course/ Medical Decision Making/ A&P                           Medical Decision Making Patient symptoms consistent with concussion. No vomiting. No focal neurological deficits on physical exam.  Pt observed in the ED. Imaging negative. Discussed symptoms of post concussive syndrome and reasons to return to the emergency department including any new  severe headaches, disequilibrium, vomiting, double vision, extremity weakness, difficulty ambulating, or any other concerning symptoms. Patient will be discharged with information pertaining to diagnosis.{Pt advised to avoid all contact sports.  Amount and/or Complexity of Data Reviewed Radiology: ordered and independent interpretation performed.    Details: I visualized and interpreted a CT head, No acute findings.           Final Clinical Impression(s) / ED Diagnoses Final diagnoses:  Minor head injury, initial encounter    Rx / DC Orders ED Discharge Orders     None         Margarita Mail, PA-C 11/04/21 2228    Hayden Rasmussen, MD 11/05/21 1113

## 2021-12-10 DIAGNOSIS — Z08 Encounter for follow-up examination after completed treatment for malignant neoplasm: Secondary | ICD-10-CM | POA: Diagnosis not present

## 2021-12-10 DIAGNOSIS — L814 Other melanin hyperpigmentation: Secondary | ICD-10-CM | POA: Diagnosis not present

## 2021-12-10 DIAGNOSIS — L821 Other seborrheic keratosis: Secondary | ICD-10-CM | POA: Diagnosis not present

## 2021-12-10 DIAGNOSIS — D225 Melanocytic nevi of trunk: Secondary | ICD-10-CM | POA: Diagnosis not present

## 2021-12-10 DIAGNOSIS — Z85828 Personal history of other malignant neoplasm of skin: Secondary | ICD-10-CM | POA: Diagnosis not present

## 2021-12-10 DIAGNOSIS — L570 Actinic keratosis: Secondary | ICD-10-CM | POA: Diagnosis not present

## 2022-03-11 DIAGNOSIS — Z1159 Encounter for screening for other viral diseases: Secondary | ICD-10-CM | POA: Diagnosis not present

## 2022-03-11 DIAGNOSIS — I700 Atherosclerosis of aorta: Secondary | ICD-10-CM | POA: Diagnosis not present

## 2022-03-11 DIAGNOSIS — Z0000 Encounter for general adult medical examination without abnormal findings: Secondary | ICD-10-CM | POA: Diagnosis not present

## 2022-03-11 DIAGNOSIS — K589 Irritable bowel syndrome without diarrhea: Secondary | ICD-10-CM | POA: Diagnosis not present

## 2022-03-11 DIAGNOSIS — R7303 Prediabetes: Secondary | ICD-10-CM | POA: Diagnosis not present

## 2022-03-11 DIAGNOSIS — I129 Hypertensive chronic kidney disease with stage 1 through stage 4 chronic kidney disease, or unspecified chronic kidney disease: Secondary | ICD-10-CM | POA: Diagnosis not present

## 2022-03-11 DIAGNOSIS — N1831 Chronic kidney disease, stage 3a: Secondary | ICD-10-CM | POA: Diagnosis not present

## 2022-04-09 DIAGNOSIS — H401121 Primary open-angle glaucoma, left eye, mild stage: Secondary | ICD-10-CM | POA: Diagnosis not present

## 2022-06-24 DIAGNOSIS — L821 Other seborrheic keratosis: Secondary | ICD-10-CM | POA: Diagnosis not present

## 2022-06-24 DIAGNOSIS — Z08 Encounter for follow-up examination after completed treatment for malignant neoplasm: Secondary | ICD-10-CM | POA: Diagnosis not present

## 2022-06-24 DIAGNOSIS — D225 Melanocytic nevi of trunk: Secondary | ICD-10-CM | POA: Diagnosis not present

## 2022-06-24 DIAGNOSIS — Z85828 Personal history of other malignant neoplasm of skin: Secondary | ICD-10-CM | POA: Diagnosis not present

## 2022-06-24 DIAGNOSIS — L570 Actinic keratosis: Secondary | ICD-10-CM | POA: Diagnosis not present

## 2022-06-24 DIAGNOSIS — L814 Other melanin hyperpigmentation: Secondary | ICD-10-CM | POA: Diagnosis not present

## 2022-08-01 DIAGNOSIS — I700 Atherosclerosis of aorta: Secondary | ICD-10-CM | POA: Diagnosis not present

## 2022-08-05 DIAGNOSIS — L570 Actinic keratosis: Secondary | ICD-10-CM | POA: Diagnosis not present

## 2022-08-05 DIAGNOSIS — L821 Other seborrheic keratosis: Secondary | ICD-10-CM | POA: Diagnosis not present

## 2022-08-05 DIAGNOSIS — H401121 Primary open-angle glaucoma, left eye, mild stage: Secondary | ICD-10-CM | POA: Diagnosis not present

## 2022-09-23 DIAGNOSIS — N1831 Chronic kidney disease, stage 3a: Secondary | ICD-10-CM | POA: Diagnosis not present

## 2022-09-23 DIAGNOSIS — I129 Hypertensive chronic kidney disease with stage 1 through stage 4 chronic kidney disease, or unspecified chronic kidney disease: Secondary | ICD-10-CM | POA: Diagnosis not present

## 2022-09-23 DIAGNOSIS — R7303 Prediabetes: Secondary | ICD-10-CM | POA: Diagnosis not present

## 2022-09-23 DIAGNOSIS — R519 Headache, unspecified: Secondary | ICD-10-CM | POA: Diagnosis not present

## 2022-09-23 DIAGNOSIS — I6529 Occlusion and stenosis of unspecified carotid artery: Secondary | ICD-10-CM | POA: Diagnosis not present

## 2022-09-23 DIAGNOSIS — I700 Atherosclerosis of aorta: Secondary | ICD-10-CM | POA: Diagnosis not present

## 2022-09-23 DIAGNOSIS — R21 Rash and other nonspecific skin eruption: Secondary | ICD-10-CM | POA: Diagnosis not present

## 2022-09-26 DIAGNOSIS — R030 Elevated blood-pressure reading, without diagnosis of hypertension: Secondary | ICD-10-CM | POA: Diagnosis not present

## 2022-09-30 ENCOUNTER — Other Ambulatory Visit: Payer: Self-pay | Admitting: Family Medicine

## 2022-09-30 DIAGNOSIS — I129 Hypertensive chronic kidney disease with stage 1 through stage 4 chronic kidney disease, or unspecified chronic kidney disease: Secondary | ICD-10-CM

## 2022-09-30 DIAGNOSIS — I700 Atherosclerosis of aorta: Secondary | ICD-10-CM

## 2022-10-07 ENCOUNTER — Ambulatory Visit: Admission: RE | Admit: 2022-10-07 | Payer: Medicare Other | Source: Ambulatory Visit

## 2022-10-07 DIAGNOSIS — I700 Atherosclerosis of aorta: Secondary | ICD-10-CM

## 2022-10-07 DIAGNOSIS — I129 Hypertensive chronic kidney disease with stage 1 through stage 4 chronic kidney disease, or unspecified chronic kidney disease: Secondary | ICD-10-CM

## 2022-10-07 DIAGNOSIS — I6523 Occlusion and stenosis of bilateral carotid arteries: Secondary | ICD-10-CM | POA: Diagnosis not present

## 2022-10-28 DIAGNOSIS — J309 Allergic rhinitis, unspecified: Secondary | ICD-10-CM | POA: Diagnosis not present

## 2022-10-28 DIAGNOSIS — I129 Hypertensive chronic kidney disease with stage 1 through stage 4 chronic kidney disease, or unspecified chronic kidney disease: Secondary | ICD-10-CM | POA: Diagnosis not present

## 2022-10-28 DIAGNOSIS — N1831 Chronic kidney disease, stage 3a: Secondary | ICD-10-CM | POA: Diagnosis not present

## 2022-10-28 DIAGNOSIS — L570 Actinic keratosis: Secondary | ICD-10-CM | POA: Diagnosis not present

## 2022-10-28 DIAGNOSIS — Z85828 Personal history of other malignant neoplasm of skin: Secondary | ICD-10-CM | POA: Diagnosis not present

## 2022-10-28 DIAGNOSIS — L578 Other skin changes due to chronic exposure to nonionizing radiation: Secondary | ICD-10-CM | POA: Diagnosis not present

## 2022-10-28 DIAGNOSIS — Z08 Encounter for follow-up examination after completed treatment for malignant neoplasm: Secondary | ICD-10-CM | POA: Diagnosis not present

## 2022-12-04 DIAGNOSIS — H401121 Primary open-angle glaucoma, left eye, mild stage: Secondary | ICD-10-CM | POA: Diagnosis not present

## 2022-12-09 DIAGNOSIS — R519 Headache, unspecified: Secondary | ICD-10-CM | POA: Diagnosis not present

## 2022-12-09 DIAGNOSIS — N1831 Chronic kidney disease, stage 3a: Secondary | ICD-10-CM | POA: Diagnosis not present

## 2022-12-09 DIAGNOSIS — I129 Hypertensive chronic kidney disease with stage 1 through stage 4 chronic kidney disease, or unspecified chronic kidney disease: Secondary | ICD-10-CM | POA: Diagnosis not present

## 2023-01-01 DIAGNOSIS — L821 Other seborrheic keratosis: Secondary | ICD-10-CM | POA: Diagnosis not present

## 2023-01-01 DIAGNOSIS — L814 Other melanin hyperpigmentation: Secondary | ICD-10-CM | POA: Diagnosis not present

## 2023-01-01 DIAGNOSIS — D225 Melanocytic nevi of trunk: Secondary | ICD-10-CM | POA: Diagnosis not present

## 2023-01-01 DIAGNOSIS — Z85828 Personal history of other malignant neoplasm of skin: Secondary | ICD-10-CM | POA: Diagnosis not present

## 2023-01-01 DIAGNOSIS — Z08 Encounter for follow-up examination after completed treatment for malignant neoplasm: Secondary | ICD-10-CM | POA: Diagnosis not present

## 2023-01-01 DIAGNOSIS — L570 Actinic keratosis: Secondary | ICD-10-CM | POA: Diagnosis not present

## 2023-01-10 DIAGNOSIS — J209 Acute bronchitis, unspecified: Secondary | ICD-10-CM | POA: Diagnosis not present

## 2023-01-10 DIAGNOSIS — J9801 Acute bronchospasm: Secondary | ICD-10-CM | POA: Diagnosis not present

## 2023-02-04 DIAGNOSIS — J208 Acute bronchitis due to other specified organisms: Secondary | ICD-10-CM | POA: Diagnosis not present

## 2023-03-04 ENCOUNTER — Ambulatory Visit: Payer: Medicare Other | Admitting: Neurology

## 2023-03-04 ENCOUNTER — Encounter: Payer: Self-pay | Admitting: Neurology

## 2023-03-04 VITALS — BP 125/76 | HR 76 | Ht 70.0 in | Wt 194.5 lb

## 2023-03-04 DIAGNOSIS — M542 Cervicalgia: Secondary | ICD-10-CM

## 2023-03-04 MED ORDER — TIZANIDINE HCL 4 MG PO TABS: 4.0000 mg | ORAL_TABLET | Freq: Four times a day (QID) | ORAL | 0 refills | Status: DC | PRN

## 2023-03-04 NOTE — Progress Notes (Signed)
[facility]  PATIENT: Darren Ponce DOB: 04/02/1950  REQUESTING CLINICIAN: Deatra James, MD HISTORY FROM: Patient  REASON FOR VISIT: Headaches, Neck pain    HISTORICAL  CHIEF COMPLAINT:  Chief Complaint  Patient presents with   New Patient (Initial Visit)    Rm13, alone, chronic headaches:intermittent, worse on ocassion. Couldn't pin point a number    HISTORY OF PRESENT ILLNESS:  This is 72 year old gentleman past medical history of hypertension, glaucoma who is presenting with complaint of headaches.  Patient reports continuous headaches since June of this year.  He described the headaches as pain that started in the back of her neck, then radiates up to the back of her head.  Pain is worse with movement.  There is also muscle tightness and radiation of pain to both shoulders.  He reports that trying to relax helps with the pain.  Pain is present all the time, but less intense then previously.  He has not tried any physical therapy or any other modality for pain treatment.    OTHER MEDICAL CONDITIONS: Hypertension, Glaucoma    REVIEW OF SYSTEMS: Full 14 system review of systems performed and negative with exception of: As noted in the HPI  ALLERGIES: Allergies  Allergen Reactions   Codeine     vomiting    HOME MEDICATIONS: Outpatient Medications Prior to Visit  Medication Sig Dispense Refill   acetaminophen (TYLENOL) 500 MG tablet Take 1,000 mg by mouth every 6 (six) hours as needed for pain.     latanoprost (XALATAN) 0.005 % ophthalmic solution Place 1 drop into the left eye daily before breakfast.     lisinopril (ZESTRIL) 5 MG tablet Take 1 tablet (5 mg total) by mouth at bedtime. 30 tablet 0   traMADol (ULTRAM) 50 MG tablet 1-2 tabs PO q6 hours prn pain 20 tablet 0   No facility-administered medications prior to visit.    PAST MEDICAL HISTORY: Past Medical History:  Diagnosis Date   Choledocholithiasis 09/02/2012   Glaucoma    Hypertension     Kidney stone    PONV (postoperative nausea and vomiting)     PAST SURGICAL HISTORY: Past Surgical History:  Procedure Laterality Date   CARPAL TUNNEL RELEASE Right 06/24/2021   Procedure: RIGHT CARPAL TUNNEL RELEASE;  Surgeon: Betha Loa, MD;  Location: [facility];  Service: Orthopedics;  Laterality: Right;   CHOLECYSTECTOMY N/A 09/04/2012   Procedure: LAPAROSCOPIC CHOLECYSTECTOMY WITH INTRAOPERATIVE CHOLANGIOGRAM;  Surgeon: Kandis Cocking, MD;  Location: [facility];  Service: General;  Laterality: N/A;   ERCP N/A 09/03/2012   Procedure: ENDOSCOPIC RETROGRADE CHOLANGIOPANCREATOGRAPHY (ERCP);  Surgeon: Iva Boop, MD;  Location: [facility];  Service: Endoscopy;  Laterality: N/A;   HERNIA REPAIR      FAMILY HISTORY: History reviewed. No pertinent family history.  SOCIAL HISTORY: Social History   Socioeconomic History   Marital status: Married    Spouse name: Not on file   Number of children: Not on file   Years of education: Not on file   Highest education level: Not on file  Occupational History   Not on file  Tobacco Use   Smoking status: Never   Smokeless tobacco: Never  Substance and Sexual Activity   Alcohol use: Yes    Comment: occasionally   Drug use: No   Sexual activity: Never  Other Topics Concern   Not on file  Social History Narrative   Not on file   Social Determinants of Health   Financial  Resource Strain: Not on file  Food Insecurity: Not on file  Transportation Needs: Not on file  Physical Activity: Not on file  Stress: Not on file  Social Connections: Not on file  Intimate Partner Violence: Not on file     PHYSICAL EXAM   GENERAL EXAM/CONSTITUTIONAL: Vitals:  Vitals:   03/04/23 0824  BP: 125/76  Pulse: 76  Weight: 194 lb 8 oz (88.2 kg)  Height: 5\' 10"  (1.778 m)   Body mass index is 27.91 kg/m. Wt Readings from Last 3 Encounters:  03/04/23 194 lb 8 oz (88.2 kg)  11/04/21 190 lb (86.2 kg)  06/24/21 194 lb 7.1 oz  (88.2 kg)   Patient is in no distress; well developed, nourished and groomed; neck is supple  MUSCULOSKELETAL: Gait, strength, tone, movements noted in Neurologic exam below  NEUROLOGIC: MENTAL STATUS:      No data to display         awake, alert, oriented to person, place and time recent and remote memory intact normal attention and concentration language fluent, comprehension intact, naming intact fund of knowledge appropriate  CRANIAL NERVE:  2nd, 3rd, 4th, 6th - visual fields full to confrontation, extraocular muscles intact, no nystagmus 5th - facial sensation symmetric 7th - facial strength symmetric 8th - hearing intact 9th - palate elevates symmetrically, uvula midline 11th - shoulder shrug symmetric 12th - tongue protrusion midline  MOTOR:  normal bulk and tone, full strength in the BUE, BLE. There is tenderness to palpation to the cervical paraspinal muscles.   SENSORY:  normal and symmetric to light touch  COORDINATION:  finger-nose-finger, fine finger movements normal  GAIT/STATION:  normal   DIAGNOSTIC DATA (LABS, IMAGING, TESTING) - I reviewed patient records, labs, notes, testing and imaging myself where available.  Lab Results  Component Value Date   WBC 10.4 05/13/2019   HGB 16.0 05/13/2019   HCT 45.9 05/13/2019   MCV 90.7 05/13/2019   PLT 145 (L) 05/13/2019      Component Value Date/Time   NA 139 05/13/2019 1101   K 4.0 05/13/2019 1101   CL 102 05/13/2019 1101   CO2 26 05/13/2019 1101   GLUCOSE 183 (H) 05/13/2019 1101   BUN 18 05/13/2019 1101   CREATININE 1.25 (H) 05/13/2019 1101   CALCIUM 9.6 05/13/2019 1101   PROT 7.4 05/13/2019 1101   ALBUMIN 4.4 05/13/2019 1101   AST 20 05/13/2019 1101   ALT 24 05/13/2019 1101   ALKPHOS 50 05/13/2019 1101   BILITOT 0.8 05/13/2019 1101   GFRNONAA 59 (L) 05/13/2019 1101   GFRAA >60 05/13/2019 1101   No results found for: "CHOL", "HDL", "LDLCALC", "LDLDIRECT", "TRIG", "CHOLHDL" No results  found for: "HGBA1C" No results found for: "VITAMINB12" No results found for: "TSH"  Head CT 11/04/2021 1. No acute intracranial abnormality. 2. Generalized cerebral atrophy with chronic white matter small vessel ischemic changes    ASSESSMENT AND PLAN  72 y.o. year old male with history of hypertension who is presenting with headaches and neck pain consistent with cervicalgia.  Will start by getting physical therapy, and obtaining a cervical spine MRI to rule out radiculopathy.  Patient reports falling last year while playing picket ball and hitting the back of her head.  I will also give him a prescription for tizanidine and will contact him to go over the results.  Patient understands to contact us if pain is getting worse, otherwise he can follow-up with PCP.   1. Cervicalgia     Patient Instructions  Trial of tizanidine nightly Referral to physical therapy for cervicalgia MRI cervical spine without contrast Continue follow-up PCP Return if worse.   Orders Placed This Encounter  Procedures   MR CERVICAL SPINE WO CONTRAST   Ambulatory referral to Physical Therapy    Meds ordered this encounter  Medications   tiZANidine (ZANAFLEX) 4 MG tablet    Sig: Take 1 tablet (4 mg total) by mouth every 6 (six) hours as needed for muscle spasms.    Dispense:  30 tablet    Refill:  0    Return if symptoms worsen or fail to improve.    Windell Norfolk, MD 03/04/2023, 9:17 AM  [facility] [address] [place], [place] [address] [phone]

## 2023-03-04 NOTE — Patient Instructions (Signed)
Trial of tizanidine nightly Referral to physical therapy for cervicalgia MRI cervical spine without contrast Continue follow-up PCP Return if worse.

## 2023-03-09 ENCOUNTER — Telehealth: Payer: Self-pay | Admitting: Neurology

## 2023-03-09 NOTE — Telephone Encounter (Signed)
[employer] medicare NPR sent to GI [phone] 

## 2023-03-10 ENCOUNTER — Other Ambulatory Visit: Payer: Self-pay

## 2023-03-10 ENCOUNTER — Ambulatory Visit: Payer: Medicare Other | Attending: Neurology | Admitting: Physical Therapy

## 2023-03-10 ENCOUNTER — Encounter: Payer: Self-pay | Admitting: Physical Therapy

## 2023-03-10 ENCOUNTER — Encounter (HOSPITAL_COMMUNITY): Payer: Self-pay

## 2023-03-10 ENCOUNTER — Emergency Department (HOSPITAL_COMMUNITY)
Admission: EM | Admit: 2023-03-10 | Discharge: 2023-03-11 | Disposition: A | Discharge status: Home / Self Care | Payer: Medicare Other | Attending: Emergency Medicine | Admitting: Emergency Medicine

## 2023-03-10 VITALS — BP 145/77 | HR 58

## 2023-03-10 DIAGNOSIS — R1033 Periumbilical pain: Secondary | ICD-10-CM | POA: Diagnosis not present

## 2023-03-10 DIAGNOSIS — R252 Cramp and spasm: Secondary | ICD-10-CM | POA: Insufficient documentation

## 2023-03-10 DIAGNOSIS — I7090 Unspecified atherosclerosis: Secondary | ICD-10-CM | POA: Diagnosis not present

## 2023-03-10 DIAGNOSIS — I10 Essential (primary) hypertension: Secondary | ICD-10-CM | POA: Insufficient documentation

## 2023-03-10 DIAGNOSIS — M542 Cervicalgia: Secondary | ICD-10-CM | POA: Insufficient documentation

## 2023-03-10 DIAGNOSIS — Z79899 Other long term (current) drug therapy: Secondary | ICD-10-CM | POA: Diagnosis not present

## 2023-03-10 DIAGNOSIS — R109 Unspecified abdominal pain: Secondary | ICD-10-CM | POA: Diagnosis not present

## 2023-03-10 LAB — CBC
HCT: 47.6 % (ref 39.0–52.0)
Hemoglobin: 16.3 g/dL (ref 13.0–17.0)
MCH: 32 pg (ref 26.0–34.0)
MCHC: 34.2 g/dL (ref 30.0–36.0)
MCV: 93.5 fL (ref 80.0–100.0)
Platelets: 212 10*3/uL (ref 150–400)
RBC: 5.09 MIL/uL (ref 4.22–5.81)
RDW: 12.7 % (ref 11.5–15.5)
WBC: 9.3 10*3/uL (ref 4.0–10.5)
nRBC: 0 % (ref 0.0–0.2)

## 2023-03-10 LAB — URINALYSIS, ROUTINE W REFLEX MICROSCOPIC
Bacteria, UA: NONE SEEN
Bilirubin Urine: NEGATIVE
Glucose, UA: NEGATIVE mg/dL
Ketones, ur: NEGATIVE mg/dL
Leukocytes,Ua: NEGATIVE
Nitrite: NEGATIVE
Protein, ur: NEGATIVE mg/dL
Specific Gravity, Urine: 1.014 (ref 1.005–1.030)
pH: 7 (ref 5.0–8.0)

## 2023-03-10 LAB — COMPREHENSIVE METABOLIC PANEL
ALT: 19 U/L (ref 0–44)
AST: 22 U/L (ref 15–41)
Albumin: 4.7 g/dL (ref 3.5–5.0)
Alkaline Phosphatase: 55 U/L (ref 38–126)
Anion gap: 9 (ref 5–15)
BUN: 14 mg/dL (ref 8–23)
CO2: 25 mmol/L (ref 22–32)
Calcium: 9.5 mg/dL (ref 8.9–10.3)
Chloride: 102 mmol/L (ref 98–111)
Creatinine, Ser: 1.18 mg/dL (ref 0.61–1.24)
GFR, Estimated: >60 mL/min (ref >60)
Glucose, Bld: 163 mg/dL — ABNORMAL HIGH (ref 70–99)
Potassium: 3.7 mmol/L (ref 3.5–5.1)
Sodium: 136 mmol/L (ref 135–145)
Total Bilirubin: 0.8 mg/dL (ref <1.2)
Total Protein: 7.8 g/dL (ref 6.5–8.1)

## 2023-03-10 LAB — LIPASE, BLOOD: Lipase: 34 U/L (ref 11–51)

## 2023-03-10 NOTE — Therapy (Unsigned)
OUTPATIENT PHYSICAL THERAPY CERVICAL EVALUATION   Patient Name: Darren Ponce MRN: [id number] DOB:04-14-1950, 72 y.o., male Today's Date: 03/11/2023  END OF SESSION:  PT End of Session - 03/10/23 0943     Visit Number 1    Number of Visits 7   6+eval   Date for PT Re-Evaluation 05/01/23   pushed out due to scheduling delays   Authorization Type [employer] MEDICARE    PT Start Time 0930    PT Stop Time 1015    PT Time Calculation (min) 45 min    Behavior During Therapy [facility] for tasks assessed/performed             Past Medical History:  Diagnosis Date   Choledocholithiasis 09/02/2012   Glaucoma    Hypertension    Kidney stone    PONV (postoperative nausea and vomiting)    Past Surgical History:  Procedure Laterality Date   CARPAL TUNNEL RELEASE Right 06/24/2021   Procedure: RIGHT CARPAL TUNNEL RELEASE;  Surgeon: Betha Loa, MD;  Location: [facility];  Service: Orthopedics;  Laterality: Right;   CHOLECYSTECTOMY N/A 09/04/2012   Procedure: LAPAROSCOPIC CHOLECYSTECTOMY WITH INTRAOPERATIVE CHOLANGIOGRAM;  Surgeon: Kandis Cocking, MD;  Location: [facility];  Service: General;  Laterality: N/A;   ERCP N/A 09/03/2012   Procedure: ENDOSCOPIC RETROGRADE CHOLANGIOPANCREATOGRAPHY (ERCP);  Surgeon: Iva Boop, MD;  Location: [facility];  Service: Endoscopy;  Laterality: N/A;   HERNIA REPAIR     Patient Active Problem List   Diagnosis Date Noted   Choledocholithiasis 09/02/2012   Acute cholecystitis 09/01/2012   Accelerated hypertension 09/01/2012   Leukocytosis 09/01/2012   Abnormal LFTs 09/01/2012   Epigastric pain 09/01/2012    PCP: Deatra James, MD  REFERRING PROVIDER: Windell Norfolk, MD  REFERRING DIAG: M54.2 (ICD-10-CM) - Cervicalgia  THERAPY DIAG:  Cervicalgia  Cramp and spasm  Rationale for Evaluation and Treatment: Rehabilitation  ONSET DATE: June 2024  SUBJECTIVE:                                                                                                                                                                                                          SUBJECTIVE STATEMENT: This started in June 2024 - he cannot remember if this happened before or after June but he fell playing pickleball and hit the back of his head.  He does not have concussion symptoms like light or noise sensitivity, but neck will tighten up all the way behind the ears and he develops a headache - these symptoms are getting mildly better.  He  states he sometimes feels like he could get dizzy but never quite gets there.  He denies positional dizziness or the feeling of actually being dizzy when he has his headaches/cramps. Hand dominance: Right  PERTINENT HISTORY:  R carpal tunnel release 2023, HTN  PAIN:  Are you having pain? No  PRECAUTIONS: Fall  RED FLAGS: None     WEIGHT BEARING RESTRICTIONS: No  FALLS:  Has patient fallen in last 6 months? Yes. Number of falls 1  LIVING ENVIRONMENT: Lives with: lives with their spouse Lives in: House/apartment Stairs: Yes: External: 1 STE steps; none Has following equipment at home: shower chair  OCCUPATION: Retired Naval architect; part-time recreational bus driver  PLOF: Independent  PATIENT GOALS: "I'd just love to figure out what this is.  I'd just love to have peace of mind to fix this if I can!"  NEXT MD VISIT: N/A  OBJECTIVE:  Note: Objective measures were completed at Evaluation unless otherwise noted.  DIAGNOSTIC FINDINGS:  MRI cervical spine ordered  PATIENT SURVEYS:  NDI 9/50 = 18% = low perceived disability  COGNITION: Overall cognitive status: Within functional limits for tasks assessed  SENSATION: WFL  POSTURE: rounded shoulders, forward head, and noted cervical flattening  PALPATION: No tenderness to any aspect of upper trap, cervical paraspinals, upper thoracic and midline c-spine   CERVICAL ROM:   Active ROM A/PROM (deg) eval  Flexion 30 degrees; stretch sensation in  posterior neck  Extension 27 degrees  Right lateral flexion   Left lateral flexion   Right rotation 29 degrees  Left rotation 31 degrees; tightness bilaterally   (Blank rows = not tested)  UPPER EXTREMITY ROM:  Active ROM Right eval Left eval  Shoulder flexion Limited to 90 bilaterally Limited to 90 bilaterally  Shoulder extension    Shoulder abduction " "  Shoulder adduction    Shoulder extension    Shoulder internal rotation    Shoulder external rotation    Elbow flexion    Elbow extension    Wrist flexion    Wrist extension    Wrist ulnar deviation    Wrist radial deviation    Wrist pronation    Wrist supination     (Blank rows = not tested)  UPPER EXTREMITY MMT:  MMT Right eval Left eval  Shoulder flexion Not formally assessed.  Shoulder extension   Shoulder abduction   Shoulder adduction   Shoulder extension   Shoulder internal rotation   Shoulder external rotation   Middle trapezius   Lower trapezius   Elbow flexion   Elbow extension   Wrist flexion   Wrist extension   Wrist ulnar deviation   Wrist radial deviation   Wrist pronation   Wrist supination   Grip strength    (Blank rows = not tested)  CERVICAL SPECIAL TESTS:  Neck flexor muscle endurance test: To be assessed.  FUNCTIONAL TESTS:  5 times sit to stand: 13.42 sec no UE support  TODAY'S TREATMENT:  DATE: N/A- eval only.   PATIENT EDUCATION:  Education details: PT POC, assessments used and to be used, and goals to be set. Person educated: Patient Education method: Explanation Education comprehension: verbalized understanding  HOME EXERCISE PROGRAM: To be established.  ASSESSMENT:  CLINICAL IMPRESSION: Patient is a 72 y.o. male who was seen today for physical therapy evaluation and treatment for cervicalgia.  Pt has a significant PMH of R carpal tunnel  release 2023 and HTN.  Identified impairments include neck pain with movement, limited AROM w/ reported tightness, and chronic postural changes.  Evaluation via the following assessment tools: 5xSTS did not indicate fall risk.  Will further assess cervical endurance at the next visit.  He would benefit from skilled PT to address impairments as noted and progress towards long term goals.  OBJECTIVE IMPAIRMENTS: decreased ROM, impaired flexibility, and pain.   ACTIVITY LIMITATIONS: reach over head  PARTICIPATION LIMITATIONS: community activity, occupation, and yard work  PERSONAL FACTORS: Age and 1 comorbidity: HTN  are also affecting patient's functional outcome.   REHAB POTENTIAL: Excellent  CLINICAL DECISION MAKING: Stable/uncomplicated  EVALUATION COMPLEXITY: Low   GOALS: Goals reviewed with patient? Yes  SHORT TERM GOALS: Target date: 04/03/2023  Pt will be independent and compliant with initial cervical stretching and strengthening HEP in order to maintain functional progress and improve mobility. Baseline: To be established. Goal status: INITIAL  LONG TERM GOALS: Target date: 04/24/2023  Pt will be independent and compliant with advanced cervical stretching and strengthening HEP in order to maintain functional progress and improve mobility. Baseline: To be established. Goal status: INITIAL  2.  Neck flexor endurance test to be assessed w/ goal set as appropriate. Baseline: To be assessed. Goal status: INITIAL  3.  Pt will improve NDI score to </=2/50 in order to demonstrate improved pain management and quality of life. Baseline: 9/50 = 18% Goal status: INITIAL  PLAN:  PT FREQUENCY: 1x/week  PT DURATION: 6 weeks  PLANNED INTERVENTIONS: 97164- PT Re-evaluation, 97110-Therapeutic exercises, 97530- Therapeutic activity, 97112- Neuromuscular re-education, 97535- Self Care, 16109- Manual therapy, Patient/Family education, Taping, Dry Needling, Joint mobilization, Spinal  mobilization, Cryotherapy, and Moist heat  PLAN FOR NEXT SESSION: Deep neck flexor endurance test - set goal.  Initiate cervical stretching and strengthening HEP.     Sadie Haber, PT, DPT 03/11/2023, 9:09 AM

## 2023-03-10 NOTE — ED Triage Notes (Signed)
Pt reports that he has had constant abdominal pain that is located in middle to right abdomen x 1 day. Pt reports nausea and vomiting x 1.

## 2023-03-11 ENCOUNTER — Emergency Department (HOSPITAL_COMMUNITY): Payer: Medicare Other

## 2023-03-11 DIAGNOSIS — R109 Unspecified abdominal pain: Secondary | ICD-10-CM | POA: Diagnosis not present

## 2023-03-11 MED ORDER — ONDANSETRON 4 MG PO TBDP: 4.0000 mg | ORAL_TABLET | Freq: Three times a day (TID) | ORAL | 0 refills | Status: DC | PRN

## 2023-03-11 NOTE — ED Provider Notes (Signed)
[place] EMERGENCY DEPARTMENT AT [facility] Provider Note   CSN: [id number] Arrival date & time: 03/10/23  2137     History  Chief Complaint  Patient presents with   Abdominal Pain    Darren Ponce is a 72 y.o. male.  The history is provided by the patient.  Abdominal Pain Darren Ponce is a 72 y.o. male who presents to the Emergency Department complaining of abdominal pain.  He presents the emergency department for evaluation of abdominal pain that started Tuesday morning.  Pain is located in the central to right abdomen and rates the right flank.  It is waxing and waning.  Overall his pain is significantly improved but it is still present.  He had associated episode of vomiting and diaphoresis earlier.  No fever, dysuria, chest pain, difficulty breathing.  Has a history of hypertension.  No prior similar symptoms. He is status post cholecystectomy.  Has a history of hypertension.     Home Medications Prior to Admission medications   Medication Sig Start Date End Date Taking? Authorizing Provider  ondansetron (ZOFRAN-ODT) 4 MG disintegrating tablet Take 1 tablet (4 mg total) by mouth every 8 (eight) hours as needed. 03/11/23  Yes Tilden Fossa, MD  acetaminophen (TYLENOL) 500 MG tablet Take 1,000 mg by mouth every 6 (six) hours as needed for pain.    [provider]  latanoprost (XALATAN) 0.005 % ophthalmic solution Place 1 drop into the left eye daily before breakfast. 04/29/19   [provider]  lisinopril (ZESTRIL) 5 MG tablet Take 1 tablet (5 mg total) by mouth at bedtime. 05/13/19   Petrucelli, Samantha R, PA-C  tiZANidine (ZANAFLEX) 4 MG tablet Take 1 tablet (4 mg total) by mouth every 6 (six) hours as needed for muscle spasms. 03/04/23   Windell Norfolk, MD      Allergies    Codeine    Review of Systems   Review of Systems  Gastrointestinal:  Positive for abdominal pain.  All other systems reviewed and are negative.   Physical  Exam Updated Vital Signs BP (!) 141/78   Pulse 69   Temp 98.5 F (36.9 C)   Resp 13   SpO2 95%  Physical Exam Vitals and nursing note reviewed.  Constitutional:      Appearance: He is well-developed.  HENT:     Head: Normocephalic and atraumatic.  Cardiovascular:     Rate and Rhythm: Normal rate and regular rhythm.  Pulmonary:     Effort: Pulmonary effort is normal. No respiratory distress.  Abdominal:     Palpations: Abdomen is soft.     Tenderness: There is no guarding or rebound.     Comments: Mild RUQ tenderness  Musculoskeletal:        General: No swelling or tenderness.     Comments: 2+ DP pulses bilaterally  Skin:    General: Skin is warm and dry.  Neurological:     Mental Status: He is alert and oriented to person, place, and time.  Psychiatric:        Behavior: Behavior normal.     ED Results / Procedures / Treatments   Labs (all labs ordered are listed, but only abnormal results are displayed) Labs Reviewed  COMPREHENSIVE METABOLIC PANEL - Abnormal; Notable for the following components:      Result Value   Glucose, Bld 163 (*)    All other components within normal limits  URINALYSIS, ROUTINE W REFLEX MICROSCOPIC - Abnormal; Notable for the following  components:   Hgb urine dipstick SMALL (*)    All other components within normal limits  LIPASE, BLOOD  CBC    EKG EKG Interpretation Date/Time:  Tuesday March 10 2023 21:53:37 EST Ventricular Rate:  63 PR Interval:  172 QRS Duration:  84 QT Interval:  425 QTC Calculation: 435 R Axis:   28  Text Interpretation: Sinus rhythm Abnormal R-wave progression, early transition Confirmed by Tilden Fossa [id number]) on 03/11/2023 1:23:24 AM  Radiology CT Renal Stone Study Result Date: 03/11/2023 CLINICAL DATA:  Abdominal/flank pain with stone suspected EXAM: CT ABDOMEN AND PELVIS WITHOUT CONTRAST TECHNIQUE: Multidetector CT imaging of the abdomen and pelvis was performed following the standard protocol  without IV contrast. RADIATION DOSE REDUCTION: This exam was performed according to the departmental dose-optimization program which includes automated exposure control, adjustment of the mA and/or kV according to patient size and/or use of iterative reconstruction technique. COMPARISON:  06/13/2020 FINDINGS: Lower chest: Subpleural bands at the bases attributed atelectasis or scarring. No acute finding Hepatobiliary: No focal liver abnormality. Small volume pneumobilia, also seen on prior and presumably from sphincterotomy at time of cholecystectomy. No biliary dilatation. Pancreas: Unremarkable. Spleen: Unremarkable. Adrenals/Urinary Tract: Negative adrenals. No hydronephrosis or ureteral stone. Punctate left renal calculus. Unremarkable bladder. Stomach/Bowel:  No obstruction. No appendicitis. Vascular/Lymphatic: No acute vascular abnormality. Atheromatous calcification scattered along the aorta. No mass or adenopathy. Reproductive:No pathologic findings. Other: No ascites or pneumoperitoneum. Prior left groin hernia repair with either residual herniated or trapped fat Musculoskeletal: No acute abnormalities. Diffuse degenerative disc narrowing and ridging in the lower thoracic and lumbar spine. Symmetric hip osteoarthritis. IMPRESSION: 1. No acute finding.  No hydronephrosis or ureteral stone. 2. Atherosclerosis and punctate left nephrolithiasis. Electronically Signed   By: Tiburcio Pea M.D.   On: 03/11/2023 04:15    Procedures Procedures    Medications Ordered in ED Medications - No data to display  ED Course/ Medical Decision Making/ A&P                                 Medical Decision Making Amount and/or Complexity of Data Reviewed Labs: ordered. Radiology: ordered.  Risk Prescription drug management.   Patient here for evaluation of central abdominal pain that radiates to the right side that occurred earlier today.  Did have an episode of diaphoresis and emesis, since resolved.  He  has mild pain at time of ED evaluation.  He is status post cholecystectomy.  He has mild tenderness on examination without peritoneal findings.  He has symmetric pulses.  Labs are reassuring with normal renal, hepatic function.  Normal CBC and urinalysis.  CT stone study was obtained, which is negative for stone.  On repeat assessment patient continues to be improved and at this time has no abdominal tenderness.  Current picture is not consistent with UTI, pancreatitis, perforated viscus, dissection, renal vein thrombosis, appendicitis.  Discussed with patient unclear source of symptoms.  Feel he is stable for discharge home with outpatient follow-up and return precautions.  Also discussed with patient incidental finding of atherosclerosis.        Final Clinical Impression(s) / ED Diagnoses Final diagnoses:  Periumbilical abdominal pain  Atherosclerosis    Rx / DC Orders ED Discharge Orders          Ordered    ondansetron (ZOFRAN-ODT) 4 MG disintegrating tablet  Every 8 hours PRN        03/11/23 0444  Tilden Fossa, MD 03/11/23 [phone]

## 2023-03-17 ENCOUNTER — Ambulatory Visit: Payer: Medicare Other | Admitting: Physical Therapy

## 2023-03-17 ENCOUNTER — Encounter: Payer: Self-pay | Admitting: Physical Therapy

## 2023-03-17 DIAGNOSIS — M542 Cervicalgia: Secondary | ICD-10-CM | POA: Diagnosis not present

## 2023-03-17 DIAGNOSIS — R252 Cramp and spasm: Secondary | ICD-10-CM | POA: Diagnosis not present

## 2023-03-17 NOTE — Patient Instructions (Signed)
Access Code: [email or web address] URL: [email or web address] Date: 03/17/2023 Prepared by: Camille Bal  Exercises - Supine Chin Tuck  - 1 x daily - 5 x weekly - 2 sets - 10 reps - 1-2 seconds hold - Supine Cervical Retraction with Towel  - 1 x daily - 5 x weekly - 2-3 sets - 10 reps - 2 seconds hold - Supine Cervical Rotation AROM on Pillow  - 1 x daily - 5 x weekly - 1-2 sets - 20 reps - Hooklying Suboccipital Release with Fingers  - 1 x daily - 5 x weekly - 3 sets - 10 reps - Seated Upper Trapezius Stretch  - 1 x daily - 5 x weekly - 1 sets - 2-3 reps - 45 - 60 seconds hold

## 2023-03-17 NOTE — Therapy (Signed)
OUTPATIENT PHYSICAL THERAPY CERVICAL TREATMENT   Patient Name: Darren Ponce MRN: [id number] DOB:1950/11/26, 72 y.o., male Today's Date: 03/17/2023  END OF SESSION:  PT End of Session - 03/17/23 1109     Visit Number 2    Number of Visits 7   6+eval   Date for PT Re-Evaluation 05/01/23   pushed out due to scheduling delays   Authorization Type [employer] MEDICARE    PT Start Time 1104    PT Stop Time 1147    PT Time Calculation (min) 43 min    Activity Tolerance Patient tolerated treatment well    Behavior During Therapy [facility] for tasks assessed/performed             Past Medical History:  Diagnosis Date   Choledocholithiasis 09/02/2012   Glaucoma    Hypertension    Kidney stone    PONV (postoperative nausea and vomiting)    Past Surgical History:  Procedure Laterality Date   CARPAL TUNNEL RELEASE Right 06/24/2021   Procedure: RIGHT CARPAL TUNNEL RELEASE;  Surgeon: Betha Loa, MD;  Location: [facility];  Service: Orthopedics;  Laterality: Right;   CHOLECYSTECTOMY N/A 09/04/2012   Procedure: LAPAROSCOPIC CHOLECYSTECTOMY WITH INTRAOPERATIVE CHOLANGIOGRAM;  Surgeon: Kandis Cocking, MD;  Location: [facility];  Service: General;  Laterality: N/A;   ERCP N/A 09/03/2012   Procedure: ENDOSCOPIC RETROGRADE CHOLANGIOPANCREATOGRAPHY (ERCP);  Surgeon: Iva Boop, MD;  Location: [facility];  Service: Endoscopy;  Laterality: N/A;   HERNIA REPAIR     Patient Active Problem List   Diagnosis Date Noted   Choledocholithiasis 09/02/2012   Acute cholecystitis 09/01/2012   Accelerated hypertension 09/01/2012   Leukocytosis 09/01/2012   Abnormal LFTs 09/01/2012   Epigastric pain 09/01/2012    PCP: Deatra James, MD  REFERRING PROVIDER: Windell Norfolk, MD  REFERRING DIAG: M54.2 (ICD-10-CM) - Cervicalgia  THERAPY DIAG:  Cervicalgia  Cramp and spasm  Rationale for Evaluation and Treatment: Rehabilitation  ONSET DATE: June 2024  SUBJECTIVE:                                                                                                                                                                                                          SUBJECTIVE STATEMENT: Pt reports no falls or acute changes.  He has an MRI set up on January 10.  He is not having a headache at current.  He reports just feeling "off" this morning (that report of feeling he might get dizzy, but does not).  He has a busy day ahead so is hoping he  will not have a headache. Hand dominance: Right  PERTINENT HISTORY:  R carpal tunnel release 2023, HTN  PAIN:  Are you having pain? No  PRECAUTIONS: Fall  RED FLAGS: None     WEIGHT BEARING RESTRICTIONS: No  FALLS:  Has patient fallen in last 6 months? Yes. Number of falls 1  LIVING ENVIRONMENT: Lives with: lives with their spouse Lives in: House/apartment Stairs: Yes: External: 1 STE steps; none Has following equipment at home: shower chair  OCCUPATION: Retired Naval architect; part-time recreational bus driver  PLOF: Independent  PATIENT GOALS: "I'd just love to figure out what this is.  I'd just love to have peace of mind to fix this if I can!"  NEXT MD VISIT: N/A  OBJECTIVE:  Note: Objective measures were completed at Evaluation unless otherwise noted.  DIAGNOSTIC FINDINGS:  MRI cervical spine ordered  PATIENT SURVEYS:  NDI 9/50 = 18% = low perceived disability  COGNITION: Overall cognitive status: Within functional limits for tasks assessed  SENSATION: WFL  POSTURE: rounded shoulders, forward head, and noted cervical flattening  PALPATION: No tenderness to any aspect of upper trap, cervical paraspinals, upper thoracic and midline c-spine   CERVICAL ROM:   Active ROM A/PROM (deg) eval  Flexion 30 degrees; stretch sensation in posterior neck  Extension 27 degrees  Right lateral flexion   Left lateral flexion   Right rotation 29 degrees  Left rotation 31 degrees; tightness bilaterally   (Blank rows = not  tested)  UPPER EXTREMITY ROM:  Active ROM Right eval Left eval  Shoulder flexion Limited to 90 bilaterally Limited to 90 bilaterally  Shoulder extension    Shoulder abduction " "  Shoulder adduction    Shoulder extension    Shoulder internal rotation    Shoulder external rotation    Elbow flexion    Elbow extension    Wrist flexion    Wrist extension    Wrist ulnar deviation    Wrist radial deviation    Wrist pronation    Wrist supination     (Blank rows = not tested)  UPPER EXTREMITY MMT:  MMT Right eval Left eval  Shoulder flexion Not formally assessed.  Shoulder extension   Shoulder abduction   Shoulder adduction   Shoulder extension   Shoulder internal rotation   Shoulder external rotation   Middle trapezius   Lower trapezius   Elbow flexion   Elbow extension   Wrist flexion   Wrist extension   Wrist ulnar deviation   Wrist radial deviation   Wrist pronation   Wrist supination   Grip strength    (Blank rows = not tested)  CERVICAL SPECIAL TESTS:  Neck flexor muscle endurance test: To be assessed.  FUNCTIONAL TESTS:  5 times sit to stand: 13.42 sec no UE support  TODAY'S TREATMENT:  DATE: 03/17/2023  -Deep neck flexor endurance test:  7.79 seconds prior to losing chin tuck -Supine chin tucks x15 w/ 1-2 sec hold -Supine cervical retraction x15 w/ 2 second hold -Supine cervical rotation to end range x20 each side -STM to the suboccipital, upper trap, and cervical paraspinals - bowstringing in left most proximal cervical paraspinals/maybe suboccipitals? > tennis ball mobilization in prone > spike ball mobilization for upper trap release - more left tenderness that right -Education on various techniques for partner to assist with STM at home -Upper trap stretch 2x1 minute each side - reported increased left tightness in  positioning  PATIENT EDUCATION:  Education details: Initial HEP and STM edu as above.  Discussed STM w/ partner in lobby.  Edu on goal of exercises and soft tissue work and brief anatomy of cervical spine. Person educated: Patient Education method: Explanation Education comprehension: verbalized understanding  HOME EXERCISE PROGRAM: Access Code: [email or web address] URL: [email or web address] Date: 03/17/2023 Prepared by: Camille Bal  Exercises - Supine Chin Tuck  - 1 x daily - 5 x weekly - 2 sets - 10 reps - 1-2 seconds hold - Supine Cervical Retraction with Towel  - 1 x daily - 5 x weekly - 2-3 sets - 10 reps - 2 seconds hold - Supine Cervical Rotation AROM on Pillow  - 1 x daily - 5 x weekly - 1-2 sets - 20 reps - Hooklying Suboccipital Release with Fingers  - 1 x daily - 5 x weekly - 3 sets - 10 reps - Seated Upper Trapezius Stretch  - 1 x daily - 5 x weekly - 1 sets - 2-3 reps - 45 - 60 seconds hold  ASSESSMENT:  CLINICAL IMPRESSION: Focus of skilled PT session today on addressing neck dysfunction and possible musculoskeletal contributions to headaches.  He has positive response to all techniques used today with mild relief at end of session.  He continues to benefit from skilled PT intervention to improve mechanics and posture and address pain management.  OBJECTIVE IMPAIRMENTS: decreased ROM, impaired flexibility, and pain.   ACTIVITY LIMITATIONS: reach over head  PARTICIPATION LIMITATIONS: community activity, occupation, and yard work  PERSONAL FACTORS: Age and 1 comorbidity: HTN  are also affecting patient's functional outcome.   REHAB POTENTIAL: Excellent  CLINICAL DECISION MAKING: Stable/uncomplicated  EVALUATION COMPLEXITY: Low   GOALS: Goals reviewed with patient? Yes  SHORT TERM GOALS: Target date: 04/03/2023  Pt will be independent and compliant with initial cervical stretching and strengthening HEP in order to maintain functional progress and  improve mobility. Baseline: To be established. Goal status: INITIAL  LONG TERM GOALS: Target date: 04/24/2023  Pt will be independent and compliant with advanced cervical stretching and strengthening HEP in order to maintain functional progress and improve mobility. Baseline: To be established. Goal status: INITIAL  2.  Pt will tolerate deep neck flexor endurance test x30 seconds in order to improve appropriate muscular coordination and posture of the cervical spine. Baseline: 7.79 sec (12/24) Goal status: INITIAL  3.  Pt will improve NDI score to </=2/50 in order to demonstrate improved pain management and quality of life. Baseline: 9/50 = 18% Goal status: INITIAL  PLAN:  PT FREQUENCY: 1x/week  PT DURATION: 6 weeks  PLANNED INTERVENTIONS: 97164- PT Re-evaluation, 97110-Therapeutic exercises, 97530- Therapeutic activity, 97112- Neuromuscular re-education, 97535- Self Care, [id number]- Manual therapy, Patient/Family education, Taping, Dry Needling, Joint mobilization, Spinal mobilization, Cryotherapy, and Moist heat  PLAN FOR NEXT SESSION: Deep neck flexor endurance test - set goal.  Initiate cervical  stretching and strengthening HEP.  Periscapular strength, seated isometrics, quadruped thoracic rotation stretch, SNAGs, chirp wheel, IASTM?   Sadie Haber, PT, DPT 03/17/2023, 12:00 PM

## 2023-03-24 ENCOUNTER — Ambulatory Visit: Payer: Medicare Other | Admitting: Physical Therapy

## 2023-03-24 ENCOUNTER — Encounter: Payer: Self-pay | Admitting: Physical Therapy

## 2023-03-24 DIAGNOSIS — M542 Cervicalgia: Secondary | ICD-10-CM | POA: Diagnosis not present

## 2023-03-24 DIAGNOSIS — R252 Cramp and spasm: Secondary | ICD-10-CM | POA: Diagnosis not present

## 2023-03-24 NOTE — Patient Instructions (Signed)
 Access Code: [email or web address] URL: [email or web address] Date: 03/24/2023 Prepared by: Daved Bull  Exercises - Supine Chin Tuck  - 1 x daily - 5 x weekly - 2 sets - 10 reps - 1-2 seconds hold - Supine Cervical Retraction with Towel  - 1 x daily - 5 x weekly - 2-3 sets - 10 reps - 2 seconds hold - Supine Cervical Rotation AROM on Pillow  - 1 x daily - 5 x weekly - 1-2 sets - 20 reps - Hooklying Suboccipital Release with Fingers  - 1 x daily - 5 x weekly - 3 sets - 10 reps - Seated Upper Trapezius Stretch  - 1 x daily - 5 x weekly - 1 sets - 2-3 reps - 45 - 60 seconds hold - Scapular retraction with resistance  - 1 x daily - 5 x weekly - 3 sets - 10 reps - Seated Shoulder Diagonal Pulls with Resistance  - 1 x daily - 5 x weekly - 3 sets - 10 reps

## 2023-03-24 NOTE — Therapy (Signed)
 OUTPATIENT PHYSICAL THERAPY CERVICAL TREATMENT   Patient Name: Darren Ponce MRN: [id number] DOB:09-08-1950, 72 y.o., male Today's Date: 03/24/2023  END OF SESSION:  PT End of Session - 03/24/23 0933     Visit Number 3    Number of Visits 7   6+eval   Date for PT Re-Evaluation 05/01/23   pushed out due to scheduling delays   Authorization Type [employer] MEDICARE    PT Start Time 0930    PT Stop Time 1015    PT Time Calculation (min) 45 min    Activity Tolerance Patient tolerated treatment well    Behavior During Therapy [facility] for tasks assessed/performed             Past Medical History:  Diagnosis Date   Choledocholithiasis 09/02/2012   Glaucoma    Hypertension    Kidney stone    PONV (postoperative nausea and vomiting)    Past Surgical History:  Procedure Laterality Date   CARPAL TUNNEL RELEASE Right 06/24/2021   Procedure: RIGHT CARPAL TUNNEL RELEASE;  Surgeon: Murrell Drivers, MD;  Location: [facility];  Service: Orthopedics;  Laterality: Right;   CHOLECYSTECTOMY N/A 09/04/2012   Procedure: LAPAROSCOPIC CHOLECYSTECTOMY WITH INTRAOPERATIVE CHOLANGIOGRAM;  Surgeon: Alm VEAR Angle, MD;  Location: [facility];  Service: General;  Laterality: N/A;   ERCP N/A 09/03/2012   Procedure: ENDOSCOPIC RETROGRADE CHOLANGIOPANCREATOGRAPHY (ERCP);  Surgeon: Lupita FORBES Commander, MD;  Location: [facility];  Service: Endoscopy;  Laterality: N/A;   HERNIA REPAIR     Patient Active Problem List   Diagnosis Date Noted   Choledocholithiasis 09/02/2012   Acute cholecystitis 09/01/2012   Accelerated hypertension 09/01/2012   Leukocytosis 09/01/2012   Abnormal LFTs 09/01/2012   Epigastric pain 09/01/2012    PCP: Sun, Vyvyan, MD  REFERRING PROVIDER: Gregg Lek, MD  REFERRING DIAG: M54.2 (ICD-10-CM) - Cervicalgia  THERAPY DIAG:  Cervicalgia  Cramp and spasm  Rationale for Evaluation and Treatment: Rehabilitation  ONSET DATE: June 2024  SUBJECTIVE:                                                                                                                                                                                                          SUBJECTIVE STATEMENT: Pt reports no falls or acute changes.  He is ready to have to his MRI done so he can have some answers.  Denies pain at current. Hand dominance: Right  PERTINENT HISTORY:  R carpal tunnel release 2023, HTN  PAIN:  Are you having pain? No  PRECAUTIONS: Fall  RED FLAGS: None  WEIGHT BEARING RESTRICTIONS: No  FALLS:  Has patient fallen in last 6 months? Yes. Number of falls 1  LIVING ENVIRONMENT: Lives with: lives with their spouse Lives in: House/apartment Stairs: Yes: External: 1 STE steps; none Has following equipment at home: shower chair  OCCUPATION: Retired naval architect; part-time recreational bus driver  PLOF: Independent  PATIENT GOALS: I'd just love to figure out what this is.  I'd just love to have peace of mind to fix this if I can!  NEXT MD VISIT: N/A  OBJECTIVE:  Note: Objective measures were completed at Evaluation unless otherwise noted.  DIAGNOSTIC FINDINGS:  MRI cervical spine ordered  PATIENT SURVEYS:  NDI 9/50 = 18% = low perceived disability  COGNITION: Overall cognitive status: Within functional limits for tasks assessed  SENSATION: WFL  POSTURE: rounded shoulders, forward head, and noted cervical flattening  PALPATION: No tenderness to any aspect of upper trap, cervical paraspinals, upper thoracic and midline c-spine   CERVICAL ROM:   Active ROM A/PROM (deg) eval  Flexion 30 degrees; stretch sensation in posterior neck  Extension 27 degrees  Right lateral flexion   Left lateral flexion   Right rotation 29 degrees  Left rotation 31 degrees; tightness bilaterally   (Blank rows = not tested)  UPPER EXTREMITY ROM:  Active ROM Right eval Left eval  Shoulder flexion Limited to 90 bilaterally Limited to 90 bilaterally  Shoulder  extension    Shoulder abduction    Shoulder adduction    Shoulder extension    Shoulder internal rotation    Shoulder external rotation    Elbow flexion    Elbow extension    Wrist flexion    Wrist extension    Wrist ulnar deviation    Wrist radial deviation    Wrist pronation    Wrist supination     (Blank rows = not tested)  UPPER EXTREMITY MMT:  MMT Right eval Left eval  Shoulder flexion Not formally assessed.  Shoulder extension   Shoulder abduction   Shoulder adduction   Shoulder extension   Shoulder internal rotation   Shoulder external rotation   Middle trapezius   Lower trapezius   Elbow flexion   Elbow extension   Wrist flexion   Wrist extension   Wrist ulnar deviation   Wrist radial deviation   Wrist pronation   Wrist supination   Grip strength    (Blank rows = not tested)  CERVICAL SPECIAL TESTS:  Neck flexor muscle endurance test: To be assessed.  FUNCTIONAL TESTS:  5 times sit to stand: 13.42 sec no UE support  TODAY'S TREATMENT:                                                                                                                              DATE: 03/24/2023  -Chirp wheel using 2 different types of pressure.  Instructed pt in tilting head in limited ROM side-to-side and up-and-down to improve singular pressure points. Had pt  practice rolling wheel for traction and myofascial release.  -IASTM/STM to the suboccipital, upper trap, and cervical paraspinals - not as much bowstringing noted today as session prior.  Pt has some midline suboccipital soreness with pressure applied but reports it is mild and very tolerable.  -Scapular squeezes w/ ER 2x12 (green band) -Diagonal pulls 2x10 each direction (green band)  PATIENT EDUCATION:  Education details:  Instructed pt where to find chirp wheel and how to use.  Additions to HEP.  Discussed gently returning to exercise routine as HEP begins to feel easy and he feels confident - wants to return  to lifting weights down the road. Person educated: Patient Education method: Explanation Education comprehension: verbalized understanding  HOME EXERCISE PROGRAM: Access Code: [email or web address] URL: [email or web address] Date: 03/24/2023 Prepared by: Daved Bull  Exercises - Supine Chin Tuck  - 1 x daily - 5 x weekly - 2 sets - 10 reps - 1-2 seconds hold - Supine Cervical Retraction with Towel  - 1 x daily - 5 x weekly - 2-3 sets - 10 reps - 2 seconds hold - Supine Cervical Rotation AROM on Pillow  - 1 x daily - 5 x weekly - 1-2 sets - 20 reps - Hooklying Suboccipital Release with Fingers  - 1 x daily - 5 x weekly - 3 sets - 10 reps - Seated Upper Trapezius Stretch  - 1 x daily - 5 x weekly - 1 sets - 2-3 reps - 45 - 60 seconds hold - Scapular retraction with resistance  - 1 x daily - 5 x weekly - 3 sets - 10 reps - Seated Shoulder Diagonal Pulls with Resistance  - 1 x daily - 5 x weekly - 3 sets - 10 reps  ASSESSMENT:  CLINICAL IMPRESSION: Emphasis of skilled session on addressing cervical myofascial pain and postural imbalance.  He tolerates all myofascial work well with positive pain response with respect to mild soreness in suboccipital region during techniques.  Made 2 additions to HEP to address periscapular strengthening.  Will continue per POC.  OBJECTIVE IMPAIRMENTS: decreased ROM, impaired flexibility, and pain.   ACTIVITY LIMITATIONS: reach over head  PARTICIPATION LIMITATIONS: community activity, occupation, and yard work  PERSONAL FACTORS: Age and 1 comorbidity: HTN  are also affecting patient's functional outcome.   REHAB POTENTIAL: Excellent  CLINICAL DECISION MAKING: Stable/uncomplicated  EVALUATION COMPLEXITY: Low   GOALS: Goals reviewed with patient? Yes  SHORT TERM GOALS: Target date: 04/03/2023  Pt will be independent and compliant with initial cervical stretching and strengthening HEP in order to maintain functional progress and improve  mobility. Baseline: To be established. Goal status: INITIAL  LONG TERM GOALS: Target date: 04/24/2023  Pt will be independent and compliant with advanced cervical stretching and strengthening HEP in order to maintain functional progress and improve mobility. Baseline: To be established. Goal status: INITIAL  2.  Pt will tolerate deep neck flexor endurance test x30 seconds in order to improve appropriate muscular coordination and posture of the cervical spine. Baseline: 7.79 sec (12/24) Goal status: INITIAL  3.  Pt will improve NDI score to </=2/50 in order to demonstrate improved pain management and quality of life. Baseline: 9/50 = 18% Goal status: INITIAL  PLAN:  PT FREQUENCY: 1x/week  PT DURATION: 6 weeks  PLANNED INTERVENTIONS: 97164- PT Re-evaluation, 97110-Therapeutic exercises, 97530- Therapeutic activity, 97112- Neuromuscular re-education, 97535- Self Care, [id number]- Manual therapy, Patient/Family education, Taping, Dry Needling, Joint mobilization, Spinal mobilization, Cryotherapy, and Moist heat  PLAN FOR NEXT SESSION: Add to cervical  stretching and strengthening HEP.  Periscapular strength, seated isometrics, quadruped thoracic rotation stretch, SNAGs, shoulder strength/lifting overhead   Daved KATHEE Bull, PT, DPT 03/24/2023, 10:19 AM

## 2023-03-30 ENCOUNTER — Encounter: Payer: Self-pay | Admitting: Physical Therapy

## 2023-03-30 ENCOUNTER — Ambulatory Visit: Payer: Medicare Other | Attending: Neurology | Admitting: Physical Therapy

## 2023-03-30 DIAGNOSIS — R252 Cramp and spasm: Secondary | ICD-10-CM | POA: Diagnosis not present

## 2023-03-30 DIAGNOSIS — M542 Cervicalgia: Secondary | ICD-10-CM | POA: Insufficient documentation

## 2023-03-30 NOTE — Therapy (Signed)
 OUTPATIENT PHYSICAL THERAPY CERVICAL TREATMENT   Patient Name: Darren Ponce MRN: [id number] DOB:03/24/1951, 73 y.o., male Today's Date: 03/30/2023  END OF SESSION:  PT End of Session - 03/30/23 0935     Visit Number 4    Number of Visits 7   6+eval   Date for PT Re-Evaluation 05/01/23   pushed out due to scheduling delays   Authorization Type [employer] MEDICARE    PT Start Time [phone]    PT Stop Time 1014    PT Time Calculation (min) 43 min    Activity Tolerance Patient tolerated treatment well    Behavior During Therapy [facility] for tasks assessed/performed             Past Medical History:  Diagnosis Date   Choledocholithiasis 09/02/2012   Glaucoma    Hypertension    Kidney stone    PONV (postoperative nausea and vomiting)    Past Surgical History:  Procedure Laterality Date   CARPAL TUNNEL RELEASE Right 06/24/2021   Procedure: RIGHT CARPAL TUNNEL RELEASE;  Surgeon: Murrell Drivers, MD;  Location: [facility];  Service: Orthopedics;  Laterality: Right;   CHOLECYSTECTOMY N/A 09/04/2012   Procedure: LAPAROSCOPIC CHOLECYSTECTOMY WITH INTRAOPERATIVE CHOLANGIOGRAM;  Surgeon: Alm VEAR Angle, MD;  Location: [facility];  Service: General;  Laterality: N/A;   ERCP N/A 09/03/2012   Procedure: ENDOSCOPIC RETROGRADE CHOLANGIOPANCREATOGRAPHY (ERCP);  Surgeon: Lupita FORBES Commander, MD;  Location: [facility];  Service: Endoscopy;  Laterality: N/A;   HERNIA REPAIR     Patient Active Problem List   Diagnosis Date Noted   Choledocholithiasis 09/02/2012   Acute cholecystitis 09/01/2012   Accelerated hypertension 09/01/2012   Leukocytosis 09/01/2012   Abnormal LFTs 09/01/2012   Epigastric pain 09/01/2012    PCP: Sun, Vyvyan, MD  REFERRING PROVIDER: Gregg Lek, MD  REFERRING DIAG: M54.2 (ICD-10-CM) - Cervicalgia  THERAPY DIAG:  Cervicalgia  Cramp and spasm  Rationale for Evaluation and Treatment: Rehabilitation  ONSET DATE: June 2024  SUBJECTIVE:                                                                                                                                                                                                          SUBJECTIVE STATEMENT: Pt reports no falls or acute changes.  His neck was bothering him a bit this morning.  He reports his neck did well all weekend while he was out-of-town. Hand dominance: Right  PERTINENT HISTORY:  R carpal tunnel release 2023, HTN  PAIN:  Are you having pain? Yes: NPRS scale: 2 Pain location:  left posterior neck Pain description: sore Aggravating factors: moving Relieving factors: rest  PRECAUTIONS: Fall  RED FLAGS: None     WEIGHT BEARING RESTRICTIONS: No  FALLS:  Has patient fallen in last 6 months? Yes. Number of falls 1  LIVING ENVIRONMENT: Lives with: lives with their spouse Lives in: House/apartment Stairs: Yes: External: 1 STE steps; none Has following equipment at home: shower chair  OCCUPATION: Retired naval architect; part-time recreational bus driver  PLOF: Independent  PATIENT GOALS: I'd just love to figure out what this is.  I'd just love to have peace of mind to fix this if I can!  NEXT MD VISIT: N/A  OBJECTIVE:  Note: Objective measures were completed at Evaluation unless otherwise noted.  DIAGNOSTIC FINDINGS:  MRI cervical spine ordered  PATIENT SURVEYS:  NDI 9/50 = 18% = low perceived disability  COGNITION: Overall cognitive status: Within functional limits for tasks assessed  SENSATION: WFL  POSTURE: rounded shoulders, forward head, and noted cervical flattening  PALPATION: No tenderness to any aspect of upper trap, cervical paraspinals, upper thoracic and midline c-spine   CERVICAL ROM:   Active ROM A/PROM (deg) eval  Flexion 30 degrees; stretch sensation in posterior neck  Extension 27 degrees  Right lateral flexion   Left lateral flexion   Right rotation 29 degrees  Left rotation 31 degrees; tightness bilaterally   (Blank rows = not  tested)  UPPER EXTREMITY ROM:  Active ROM Right eval Left eval  Shoulder flexion Limited to 90 bilaterally Limited to 90 bilaterally  Shoulder extension    Shoulder abduction    Shoulder adduction    Shoulder extension    Shoulder internal rotation    Shoulder external rotation    Elbow flexion    Elbow extension    Wrist flexion    Wrist extension    Wrist ulnar deviation    Wrist radial deviation    Wrist pronation    Wrist supination     (Blank rows = not tested)  UPPER EXTREMITY MMT:  MMT Right eval Left eval  Shoulder flexion Not formally assessed.  Shoulder extension   Shoulder abduction   Shoulder adduction   Shoulder extension   Shoulder internal rotation   Shoulder external rotation   Middle trapezius   Lower trapezius   Elbow flexion   Elbow extension   Wrist flexion   Wrist extension   Wrist ulnar deviation   Wrist radial deviation   Wrist pronation   Wrist supination   Grip strength    (Blank rows = not tested)  CERVICAL SPECIAL TESTS:  Neck flexor muscle endurance test: To be assessed.  FUNCTIONAL TESTS:  5 times sit to stand: 13.42 sec no UE support  TODAY'S TREATMENT:                                                                                                                              DATE: 03/30/2023 -IASTM/STM to the suboccipital and  cervical paraspinals.  Pt has conversion of left to right sided neck soreness, but reports techniques feel great. -Cervical extension SNAG x20, pt reports good pressure/pain response -Cervical rotation SNAG 2x20 alternating sides, pt has more challenge with this task vs extension reporting more stiffness with onset of movement, but good response overall -Wall clocks for periscapular engagement x12 bilaterally -Wall slides x20 w/ red theraband  PATIENT EDUCATION:  Education details:  Continue HEP to reinforce STM.  Continued to encourage gradual increase in preferred activities at home.  Discussed  using heat/topicals like tiger balm if not allergic to ingredients for tension/soreness relief if any following session. Person educated: Patient Education method: Explanation Education comprehension: verbalized understanding  HOME EXERCISE PROGRAM: Access Code: [email or web address] URL: [email or web address] Date: 03/30/2023 Prepared by: Daved Bull  Exercises - Supine Chin Tuck  - 1 x daily - 5 x weekly - 2 sets - 10 reps - 1-2 seconds hold - Supine Cervical Retraction with Towel  - 1 x daily - 5 x weekly - 2-3 sets - 10 reps - 2 seconds hold - Hooklying Suboccipital Release with Fingers  - 1 x daily - 5 x weekly - 3 sets - 10 reps - Seated Upper Trapezius Stretch  - 1 x daily - 5 x weekly - 1 sets - 2-3 reps - 45 - 60 seconds hold - Scapular retraction with resistance  - 1 x daily - 5 x weekly - 3 sets - 10 reps - Seated Shoulder Diagonal Pulls with Resistance  - 1 x daily - 5 x weekly - 3 sets - 10 reps - Seated Assisted Cervical Rotation with Towel  - 1 x daily - 5 x weekly - 2 sets - 10 reps - Mid-Lower Cervical Extension SNAG with Strap  - 1 x daily - 5 x weekly - 2 sets - 10 reps  ASSESSMENT:  CLINICAL IMPRESSION: Focus of skilled session on continuing to perform soft tissue work as pt has great response to techniques and is compliant to them at home with stretching.  Progressed HEP this visit to improve rotation and extension mobility in painfree ROM.  He continues to benefit from skilled PT to address pain and mobility deficits as outlined in ongoing PT POC.  Will continue per POC.  OBJECTIVE IMPAIRMENTS: decreased ROM, impaired flexibility, and pain.   ACTIVITY LIMITATIONS: reach over head  PARTICIPATION LIMITATIONS: community activity, occupation, and yard work  PERSONAL FACTORS: Age and 1 comorbidity: HTN  are also affecting patient's functional outcome.   REHAB POTENTIAL: Excellent  CLINICAL DECISION MAKING: Stable/uncomplicated  EVALUATION COMPLEXITY:  Low   GOALS: Goals reviewed with patient? Yes  SHORT TERM GOALS: Target date: 04/03/2023  Pt will be independent and compliant with initial cervical stretching and strengthening HEP in order to maintain functional progress and improve mobility. Baseline: To be established. Goal status: INITIAL  LONG TERM GOALS: Target date: 04/24/2023  Pt will be independent and compliant with advanced cervical stretching and strengthening HEP in order to maintain functional progress and improve mobility. Baseline: To be established. Goal status: INITIAL  2.  Pt will tolerate deep neck flexor endurance test x30 seconds in order to improve appropriate muscular coordination and posture of the cervical spine. Baseline: 7.79 sec (12/24) Goal status: INITIAL  3.  Pt will improve NDI score to </=2/50 in order to demonstrate improved pain management and quality of life. Baseline: 9/50 = 18% Goal status: INITIAL  PLAN:  PT FREQUENCY: 1x/week  PT DURATION: 6 weeks  PLANNED INTERVENTIONS:  02835- PT Re-evaluation, 97110-Therapeutic exercises, 97530- Therapeutic activity, [id number]- Neuromuscular re-education, 97535- Self Care, [id number]- Manual therapy, Patient/Family education, Taping, Dry Needling, Joint mobilization, Spinal mobilization, Cryotherapy, and Moist heat  PLAN FOR NEXT SESSION: Add to cervical stretching and strengthening HEP.  Periscapular strength - lat pulls/overhead press, seated isometrics, quadruped thoracic rotation stretch, shoulder strength/lifting overhead  Daved KATHEE Bull, PT, DPT 03/30/2023, 10:15 AM

## 2023-03-30 NOTE — Patient Instructions (Signed)
 Access Code: [email or web address] URL: [email or web address] Date: 03/30/2023 Prepared by: Daved Bull  Exercises - Supine Chin Tuck  - 1 x daily - 5 x weekly - 2 sets - 10 reps - 1-2 seconds hold - Supine Cervical Retraction with Towel  - 1 x daily - 5 x weekly - 2-3 sets - 10 reps - 2 seconds hold - Hooklying Suboccipital Release with Fingers  - 1 x daily - 5 x weekly - 3 sets - 10 reps - Seated Upper Trapezius Stretch  - 1 x daily - 5 x weekly - 1 sets - 2-3 reps - 45 - 60 seconds hold - Scapular retraction with resistance  - 1 x daily - 5 x weekly - 3 sets - 10 reps - Seated Shoulder Diagonal Pulls with Resistance  - 1 x daily - 5 x weekly - 3 sets - 10 reps - Seated Assisted Cervical Rotation with Towel  - 1 x daily - 5 x weekly - 2 sets - 10 reps - Mid-Lower Cervical Extension SNAG with Strap  - 1 x daily - 5 x weekly - 2 sets - 10 reps

## 2023-04-02 ENCOUNTER — Other Ambulatory Visit: Payer: Self-pay

## 2023-04-02 ENCOUNTER — Encounter (HOSPITAL_BASED_OUTPATIENT_CLINIC_OR_DEPARTMENT_OTHER): Payer: Self-pay | Admitting: Emergency Medicine

## 2023-04-02 ENCOUNTER — Emergency Department (HOSPITAL_BASED_OUTPATIENT_CLINIC_OR_DEPARTMENT_OTHER)
Admission: EM | Admit: 2023-04-02 | Discharge: 2023-04-02 | Disposition: A | Discharge status: Home / Self Care | Payer: Medicare Other | Attending: Emergency Medicine | Admitting: Emergency Medicine

## 2023-04-02 DIAGNOSIS — Z79899 Other long term (current) drug therapy: Secondary | ICD-10-CM | POA: Diagnosis not present

## 2023-04-02 DIAGNOSIS — R197 Diarrhea, unspecified: Secondary | ICD-10-CM | POA: Diagnosis not present

## 2023-04-02 DIAGNOSIS — I10 Essential (primary) hypertension: Secondary | ICD-10-CM | POA: Insufficient documentation

## 2023-04-02 DIAGNOSIS — Z20822 Contact with and (suspected) exposure to covid-19: Secondary | ICD-10-CM | POA: Diagnosis not present

## 2023-04-02 DIAGNOSIS — R112 Nausea with vomiting, unspecified: Secondary | ICD-10-CM | POA: Insufficient documentation

## 2023-04-02 LAB — COMPREHENSIVE METABOLIC PANEL
ALT: 14 U/L (ref 0–44)
AST: 18 U/L (ref 15–41)
Albumin: 4.8 g/dL (ref 3.5–5.0)
Alkaline Phosphatase: 64 U/L (ref 38–126)
Anion gap: 11 (ref 5–15)
BUN: 18 mg/dL (ref 8–23)
CO2: 26 mmol/L (ref 22–32)
Calcium: 9.2 mg/dL (ref 8.9–10.3)
Chloride: 102 mmol/L (ref 98–111)
Creatinine, Ser: 1.22 mg/dL (ref 0.61–1.24)
GFR, Estimated: >60 mL/min (ref >60)
Glucose, Bld: 158 mg/dL — ABNORMAL HIGH (ref 70–99)
Potassium: 4.1 mmol/L (ref 3.5–5.1)
Sodium: 139 mmol/L (ref 135–145)
Total Bilirubin: 0.8 mg/dL (ref 0.0–1.2)
Total Protein: 7.6 g/dL (ref 6.5–8.1)

## 2023-04-02 LAB — RESP PANEL BY RT-PCR (RSV, FLU A&B, COVID)  RVPGX2
Influenza A by PCR: NEGATIVE
Influenza B by PCR: NEGATIVE
Resp Syncytial Virus by PCR: NEGATIVE
SARS Coronavirus 2 by RT PCR: NEGATIVE

## 2023-04-02 LAB — CBC
HCT: 48 % (ref 39.0–52.0)
Hemoglobin: 16.8 g/dL (ref 13.0–17.0)
MCH: 32.2 pg (ref 26.0–34.0)
MCHC: 35 g/dL (ref 30.0–36.0)
MCV: 92.1 fL (ref 80.0–100.0)
Platelets: 209 10*3/uL (ref 150–400)
RBC: 5.21 MIL/uL (ref 4.22–5.81)
RDW: 12.9 % (ref 11.5–15.5)
WBC: 12.2 10*3/uL — ABNORMAL HIGH (ref 4.0–10.5)
nRBC: 0 % (ref 0.0–0.2)

## 2023-04-02 LAB — URINALYSIS, ROUTINE W REFLEX MICROSCOPIC
Bacteria, UA: NONE SEEN
Bilirubin Urine: NEGATIVE
Glucose, UA: NEGATIVE mg/dL
Ketones, ur: NEGATIVE mg/dL
Leukocytes,Ua: NEGATIVE
Nitrite: NEGATIVE
Specific Gravity, Urine: 1.026 (ref 1.005–1.030)
pH: 5.5 (ref 5.0–8.0)

## 2023-04-02 LAB — LIPASE, BLOOD: Lipase: 13 U/L (ref 11–51)

## 2023-04-02 MED ORDER — ONDANSETRON HCL 4 MG PO TABS: 4.0000 mg | ORAL_TABLET | Freq: Four times a day (QID) | ORAL | 0 refills | Status: DC

## 2023-04-02 MED ORDER — ONDANSETRON HCL 4 MG/2ML IJ SOLN
4.0000 mg | Freq: Once | INTRAMUSCULAR | Status: AC
  Administered 2023-04-02: 4 mg via INTRAVENOUS
  Filled 2023-04-02: qty 2

## 2023-04-02 MED ORDER — SODIUM CHLORIDE 0.9 % IV BOLUS
1000.0000 mL | Freq: Once | INTRAVENOUS | Status: AC
  Administered 2023-04-02: 1000 mL via INTRAVENOUS

## 2023-04-02 NOTE — ED Triage Notes (Signed)
 Vomiting /diarrhea started last night, "I feel awful." No fevers, no abdominal pain,but feels dehydrated and nausea

## 2023-04-02 NOTE — ED Notes (Addendum)
 Pt given water and saltine crackers for PO challenge. Pt reports feeling nauseas but no vomiting. PA notified.

## 2023-04-02 NOTE — ED Notes (Signed)
 Attempted x2 to insert iv unsuccessful, left ac and right anterior forearm. Able to obtain blood

## 2023-04-02 NOTE — ED Notes (Signed)
 ED Provider at bedside.

## 2023-04-02 NOTE — ED Provider Notes (Signed)
 [place] EMERGENCY DEPARTMENT AT [facility] Provider Note   CSN: [id number] Arrival date & time: 04/02/23  1501     History  Chief Complaint  Patient presents with   Diarrhea    Darren Ponce is a 73 y.o. male with history of cholecystitis s/p cholecystectomy in 2014, hypertension, abnormal LFTs, glaucoma, who presents the emergency department complaining of vomiting and diarrhea starting last night.  Patient denies abdominal pain or fever.  States he feels dehydrated. No bloody emesis or stools.    Diarrhea Associated symptoms: vomiting        Home Medications Prior to Admission medications   Medication Sig Start Date End Date Taking? Authorizing Provider  ondansetron  (ZOFRAN ) 4 MG tablet Take 1 tablet (4 mg total) by mouth every 6 (six) hours. 04/02/23  Yes Anaelle Dunton T, PA-C  sildenafil (VIAGRA) 100 MG tablet Take 100 mg by mouth daily as needed. 03/12/23  Yes [provider]  acetaminophen  (TYLENOL ) 500 MG tablet Take 1,000 mg by mouth every 6 (six) hours as needed for pain.    [provider]  latanoprost (XALATAN) 0.005 % ophthalmic solution Place 1 drop into the left eye daily before breakfast. 04/29/19   [provider]  lisinopril  (ZESTRIL ) 5 MG tablet Take 1 tablet (5 mg total) by mouth at bedtime. 05/13/19   Petrucelli, Samantha R, PA-C  ondansetron  (ZOFRAN -ODT) 4 MG disintegrating tablet Take 1 tablet (4 mg total) by mouth every 8 (eight) hours as needed. 03/11/23   Griselda Norris, MD  tiZANidine  (ZANAFLEX ) 4 MG tablet Take 1 tablet (4 mg total) by mouth every 6 (six) hours as needed for muscle spasms. 03/04/23   Gregg Lek, MD      Allergies    Codeine    Review of Systems   Review of Systems  Gastrointestinal:  Positive for diarrhea, nausea and vomiting.  All other systems reviewed and are negative.   Physical Exam Updated Vital Signs BP (!) 147/83   Pulse 95   Temp 98.3 F (36.8 C) (Oral)   Resp 16   Wt 88  kg   SpO2 96%   BMI 27.84 kg/m  Physical Exam Vitals and nursing note reviewed.  Constitutional:      Appearance: Normal appearance.  HENT:     Head: Normocephalic and atraumatic.  Eyes:     Conjunctiva/sclera: Conjunctivae normal.  Cardiovascular:     Rate and Rhythm: Normal rate and regular rhythm.  Pulmonary:     Effort: Pulmonary effort is normal. No respiratory distress.     Breath sounds: Normal breath sounds.  Abdominal:     General: There is no distension.     Palpations: Abdomen is soft.     Tenderness: There is no abdominal tenderness.  Skin:    General: Skin is warm and dry.  Neurological:     General: No focal deficit present.     Mental Status: He is alert.     ED Results / Procedures / Treatments   Labs (all labs ordered are listed, but only abnormal results are displayed) Labs Reviewed  COMPREHENSIVE METABOLIC PANEL - Abnormal; Notable for the following components:      Result Value   Glucose, Bld 158 (*)    All other components within normal limits  CBC - Abnormal; Notable for the following components:   WBC 12.2 (*)    All other components within normal limits  URINALYSIS, ROUTINE W REFLEX MICROSCOPIC - Abnormal; Notable for the following components:  Hgb urine dipstick SMALL (*)    Protein, ur TRACE (*)    All other components within normal limits  RESP PANEL BY RT-PCR (RSV, FLU A&B, COVID)  RVPGX2  LIPASE, BLOOD    EKG None  Radiology No results found.  Procedures Procedures    Medications Ordered in ED Medications  sodium chloride  0.9 % bolus 1,000 mL (0 mLs Intravenous Stopped 04/02/23 1749)  ondansetron  (ZOFRAN ) injection 4 mg (4 mg Intravenous Given 04/02/23 1656)    ED Course/ Medical Decision Making/ A&P                                 Medical Decision Making Amount and/or Complexity of Data Reviewed Labs: ordered.   This patient is a 73 y.o. male  who presents to the ED for concern of vomiting and diarrhea since last  night.   Differential diagnoses prior to evaluation: The emergent differential diagnosis includes, but is not limited to,  Infectious diarrhea (viral, bacterial), GI Bleed, Appendicitis, Mesenteric Ischemia, Diverticulitis, endocrine causes (adrenal, thyroid), Toxicologic, IBD. This is not an exhaustive differential.   Past Medical History / Co-morbidities / Social History: cholecystitis s/p cholecystectomy in 2014, hypertension, abnormal LFTs, glaucoma  Additional history: Chart reviewed. Pertinent results include: Reviewed ER visit note from 03/10/23 for abdominal pain. No emergent etiology found for symptoms, was discharged in stable condition. Reviewed past EKG, normal QTC.  Physical Exam: Physical exam performed. The pertinent findings include: Normal vital signs, no acute distress. Abdomen soft and non-tender.   Lab Tests/Imaging studies: I personally interpreted labs/imaging and the pertinent results include:  WBC 12.2, normal hemoglobin. CMP with glucose 158, otherwise normal. Normal lipase. UA with small hemoglobin and trace protein, overall unremarkable. Respiratory panel negative.   As patient has reassuring laboratory evaluation and benign abdominal exam, will defer emergent imaging at this time.   Medications: I ordered medication including IVF, zofran .  I have reviewed the patients home medicines and have made adjustments as needed. On reevaluation, patient feels improved, passed PO challenge. Still feels nauseous but tolerated PO fluids and crackers.    Disposition: After consideration of the diagnostic results and the patients response to treatment, I feel that emergency department workup does not suggest an emergent condition requiring admission or immediate intervention beyond what has been performed at this time. The plan is: discharge to home. Suspect likely viral enteritis. Lab work and exam very reassuring. Will recommend oral hydration and prescribe zofran . The patient  is safe for discharge and has been instructed to return immediately for worsening symptoms, change in symptoms or any other concerns.  Final Clinical Impression(s) / ED Diagnoses Final diagnoses:  Nausea vomiting and diarrhea    Rx / DC Orders ED Discharge Orders          Ordered    ondansetron  (ZOFRAN ) 4 MG tablet  Every 6 hours        04/02/23 1833           Portions of this report may have been transcribed using voice recognition software. Every effort was made to ensure accuracy; however, inadvertent computerized transcription errors may be present.    Cameshia Cressman T, PA-C 04/02/23 1834    Mannie Pac T, DO 04/02/23 2253

## 2023-04-02 NOTE — Discharge Instructions (Addendum)
 You were seen in the emergency department today for vomiting and diarrhea.  As we discussed, your lab work was reassuring. I suspect that you likely have a stomach virus, which we often call gastroenteritis. We treat this with controlling your symptoms and good hydration. I have sent a prescription of nausea medication to your pharmacy. I recommend trying to drink plenty of water and starting with eating bland foods, all as tolerated. You can slowly work your way up to more substantial food after that.  I have sent a prescription for nausea medication to your pharmacy.   Continue to monitor how you're doing and return to the ER for new or worsening symptoms such as fever, worsening pain, inability to keep anything down, etc.

## 2023-04-02 NOTE — ED Notes (Signed)
Reviewed discharge instructions, medications, and home care with pt. Pt verbalized understanding and had no further questions. Pt exited ED without complications.

## 2023-04-03 ENCOUNTER — Other Ambulatory Visit: Payer: Medicare Other

## 2023-04-06 ENCOUNTER — Ambulatory Visit: Payer: Medicare Other | Admitting: Physical Therapy

## 2023-04-06 ENCOUNTER — Encounter: Payer: Self-pay | Admitting: Physical Therapy

## 2023-04-06 DIAGNOSIS — M542 Cervicalgia: Secondary | ICD-10-CM | POA: Diagnosis not present

## 2023-04-06 DIAGNOSIS — R252 Cramp and spasm: Secondary | ICD-10-CM

## 2023-04-06 NOTE — Patient Instructions (Signed)
 Access Code: [email or web address] URL: [email or web address] Date: 04/06/2023 Prepared by: Daved Bull  Exercises - Supine Chin Tuck  - 1 x daily - 5 x weekly - 2 sets - 10 reps - 1-2 seconds hold - Supine Cervical Retraction with Towel  - 1 x daily - 5 x weekly - 2-3 sets - 10 reps - 2 seconds hold - Hooklying Suboccipital Release with Fingers  - 1 x daily - 5 x weekly - 3 sets - 10 reps - Seated Upper Trapezius Stretch  - 1 x daily - 5 x weekly - 1 sets - 2-3 reps - 45 - 60 seconds hold - Scapular retraction with resistance  - 1 x daily - 5 x weekly - 3 sets - 10 reps - Seated Shoulder Diagonal Pulls with Resistance  - 1 x daily - 5 x weekly - 3 sets - 10 reps - Seated Assisted Cervical Rotation with Towel  - 1 x daily - 5 x weekly - 2 sets - 10 reps - Mid-Lower Cervical Extension SNAG with Strap  - 1 x daily - 5 x weekly - 2 sets - 10 reps - Standing Lat Pull Down with Resistance - Elbows Bent  - 1 x daily - 5 x weekly - 3 sets - 10 reps

## 2023-04-06 NOTE — Therapy (Signed)
 OUTPATIENT PHYSICAL THERAPY CERVICAL TREATMENT   Patient Name: Darren Ponce MRN: [id number] DOB:06-06-1950, 73 y.o., male Today's Date: 04/06/2023  END OF SESSION:  PT End of Session - 04/06/23 1015     Visit Number 5    Number of Visits 7   6+eval   Date for PT Re-Evaluation 05/01/23   pushed out due to scheduling delays   Authorization Type [employer] MEDICARE    PT Start Time 1014    PT Stop Time 1058    PT Time Calculation (min) 44 min    Activity Tolerance Patient tolerated treatment well    Behavior During Therapy [facility] for tasks assessed/performed             Past Medical History:  Diagnosis Date   Choledocholithiasis 09/02/2012   Glaucoma    Hypertension    Kidney stone    PONV (postoperative nausea and vomiting)    Past Surgical History:  Procedure Laterality Date   CARPAL TUNNEL RELEASE Right 06/24/2021   Procedure: RIGHT CARPAL TUNNEL RELEASE;  Surgeon: Murrell Drivers, MD;  Location: [facility];  Service: Orthopedics;  Laterality: Right;   CHOLECYSTECTOMY N/A 09/04/2012   Procedure: LAPAROSCOPIC CHOLECYSTECTOMY WITH INTRAOPERATIVE CHOLANGIOGRAM;  Surgeon: Alm VEAR Angle, MD;  Location: [facility];  Service: General;  Laterality: N/A;   ERCP N/A 09/03/2012   Procedure: ENDOSCOPIC RETROGRADE CHOLANGIOPANCREATOGRAPHY (ERCP);  Surgeon: Lupita FORBES Commander, MD;  Location: [facility];  Service: Endoscopy;  Laterality: N/A;   HERNIA REPAIR     Patient Active Problem List   Diagnosis Date Noted   Choledocholithiasis 09/02/2012   Acute cholecystitis 09/01/2012   Accelerated hypertension 09/01/2012   Leukocytosis 09/01/2012   Abnormal LFTs 09/01/2012   Epigastric pain 09/01/2012    PCP: Sun, Vyvyan, MD  REFERRING PROVIDER: Gregg Lek, MD  REFERRING DIAG: M54.2 (ICD-10-CM) - Cervicalgia  THERAPY DIAG:  Cervicalgia  Cramp and spasm  Rationale for Evaluation and Treatment: Rehabilitation  ONSET DATE: June 2024  SUBJECTIVE:                                                                                                                                                                                                          SUBJECTIVE STATEMENT: Pt reports no falls or acute changes.  He was recently sick with a stomach bug but is feeling some better today.  Hand dominance: Right  PERTINENT HISTORY:  R carpal tunnel release 2023, HTN  PAIN:  Are you having pain? Yes: NPRS scale: 2-3 Pain location: left posterior neck Pain description: sore Aggravating  factors: moving Relieving factors: rest  PRECAUTIONS: Fall  RED FLAGS: None     WEIGHT BEARING RESTRICTIONS: No  FALLS:  Has patient fallen in last 6 months? Yes. Number of falls 1  LIVING ENVIRONMENT: Lives with: lives with their spouse Lives in: House/apartment Stairs: Yes: External: 1 STE steps; none Has following equipment at home: shower chair  OCCUPATION: Retired naval architect; part-time recreational bus driver  PLOF: Independent  PATIENT GOALS: I'd just love to figure out what this is.  I'd just love to have peace of mind to fix this if I can!  NEXT MD VISIT: N/A  OBJECTIVE:  Note: Objective measures were completed at Evaluation unless otherwise noted.  DIAGNOSTIC FINDINGS:  MRI cervical spine ordered  PATIENT SURVEYS:  NDI 9/50 = 18% = low perceived disability  COGNITION: Overall cognitive status: Within functional limits for tasks assessed  SENSATION: WFL  POSTURE: rounded shoulders, forward head, and noted cervical flattening  PALPATION: No tenderness to any aspect of upper trap, cervical paraspinals, upper thoracic and midline c-spine   CERVICAL ROM:   Active ROM A/PROM (deg) eval  Flexion 30 degrees; stretch sensation in posterior neck  Extension 27 degrees  Right lateral flexion   Left lateral flexion   Right rotation 29 degrees  Left rotation 31 degrees; tightness bilaterally   (Blank rows = not tested)  UPPER EXTREMITY ROM:  Active  ROM Right eval Left eval  Shoulder flexion Limited to 90 bilaterally Limited to 90 bilaterally  Shoulder extension    Shoulder abduction    Shoulder adduction    Shoulder extension    Shoulder internal rotation    Shoulder external rotation    Elbow flexion    Elbow extension    Wrist flexion    Wrist extension    Wrist ulnar deviation    Wrist radial deviation    Wrist pronation    Wrist supination     (Blank rows = not tested)  UPPER EXTREMITY MMT:  MMT Right eval Left eval  Shoulder flexion Not formally assessed.  Shoulder extension   Shoulder abduction   Shoulder adduction   Shoulder extension   Shoulder internal rotation   Shoulder external rotation   Middle trapezius   Lower trapezius   Elbow flexion   Elbow extension   Wrist flexion   Wrist extension   Wrist ulnar deviation   Wrist radial deviation   Wrist pronation   Wrist supination   Grip strength    (Blank rows = not tested)  CERVICAL SPECIAL TESTS:  Neck flexor muscle endurance test: To be assessed.  FUNCTIONAL TESTS:  5 times sit to stand: 13.42 sec no UE support  TODAY'S TREATMENT:                                                                                                                              DATE: 04/06/2023 -Lat pulldown 2x15 w/ green theraband -Bilateral overhead press w/ 4lb dumbbells  3x12 -Quadruped thread the needle x10 each side -Child's pose 2x30 seconds each (midline, left and right) -Seated cervical isometrics 10x3 sec each  (lateral rotation, lateral flexion, forward flexion, and extension); pt reports feeling most response to extension -Cervical extension AROM x 15 working into progressed ROM -Shoulder abduction 2x12 w/ bilateral 3lb dumbbells -Seated scapular protraction w/ bilateral 3lb dumbbells 2x10  PATIENT EDUCATION:  Education details:  Continue HEP to reinforce STM.  Continued to encourage gradual increase in preferred activities at home.  Purpose of  shoulder strengthening today and thoracic mobility to support cervical work. Person educated: Patient Education method: Explanation Education comprehension: verbalized understanding  HOME EXERCISE PROGRAM: Access Code: [email or web address] URL: [email or web address] Date: 04/06/2023 Prepared by: Daved Bull  Exercises - Supine Chin Tuck  - 1 x daily - 5 x weekly - 2 sets - 10 reps - 1-2 seconds hold - Supine Cervical Retraction with Towel  - 1 x daily - 5 x weekly - 2-3 sets - 10 reps - 2 seconds hold - Hooklying Suboccipital Release with Fingers  - 1 x daily - 5 x weekly - 3 sets - 10 reps - Seated Upper Trapezius Stretch  - 1 x daily - 5 x weekly - 1 sets - 2-3 reps - 45 - 60 seconds hold - Scapular retraction with resistance  - 1 x daily - 5 x weekly - 3 sets - 10 reps - Seated Shoulder Diagonal Pulls with Resistance  - 1 x daily - 5 x weekly - 3 sets - 10 reps - Seated Assisted Cervical Rotation with Towel  - 1 x daily - 5 x weekly - 2 sets - 10 reps - Mid-Lower Cervical Extension SNAG with Strap  - 1 x daily - 5 x weekly - 2 sets - 10 reps - Standing Lat Pull Down with Resistance - Elbows Bent  - 1 x daily - 5 x weekly - 3 sets - 10 reps  ASSESSMENT:  CLINICAL IMPRESSION: Goal of skilled PT session today on addressing thoracic mobility and periscapular and shoulder strengthening to support the cervical region and restore proper body mechanics.  Patient tolerates all challenges well focusing on form with light resistance.  He would do well to progress to lifting techniques to assess and prevent compensatory patterns that could potentially strain the neck or exacerbate headaches.  Will continue per POC.  OBJECTIVE IMPAIRMENTS: decreased ROM, impaired flexibility, and pain.   ACTIVITY LIMITATIONS: reach over head  PARTICIPATION LIMITATIONS: community activity, occupation, and yard work  PERSONAL FACTORS: Age and 1 comorbidity: HTN  are also affecting patient's functional  outcome.   REHAB POTENTIAL: Excellent  CLINICAL DECISION MAKING: Stable/uncomplicated  EVALUATION COMPLEXITY: Low   GOALS: Goals reviewed with patient? Yes  SHORT TERM GOALS: Target date: 04/03/2023  Pt will be independent and compliant with initial cervical stretching and strengthening HEP in order to maintain functional progress and improve mobility. Baseline: To be established. Goal status: INITIAL  LONG TERM GOALS: Target date: 04/24/2023  Pt will be independent and compliant with advanced cervical stretching and strengthening HEP in order to maintain functional progress and improve mobility. Baseline: To be established. Goal status: INITIAL  2.  Pt will tolerate deep neck flexor endurance test x30 seconds in order to improve appropriate muscular coordination and posture of the cervical spine. Baseline: 7.79 sec (12/24) Goal status: INITIAL  3.  Pt will improve NDI score to </=2/50 in order to demonstrate improved pain management and quality of life. Baseline: 9/50 = 18%  Goal status: INITIAL  PLAN:  PT FREQUENCY: 1x/week  PT DURATION: 6 weeks  PLANNED INTERVENTIONS: 97164- PT Re-evaluation, 97110-Therapeutic exercises, 97530- Therapeutic activity, 97112- Neuromuscular re-education, 97535- Self Care, [id number]- Manual therapy, Patient/Family education, Taping, Dry Needling, Joint mobilization, Spinal mobilization, Cryotherapy, and Moist heat  PLAN FOR NEXT SESSION: Add to cervical stretching and strengthening HEP.  STM as needed. shoulder strength/lifting overhead, general lifting technique/farmer's carry  Daved KATHEE Bull, PT, DPT 04/06/2023, 11:19 AM

## 2023-04-11 ENCOUNTER — Ambulatory Visit
Admission: RE | Admit: 2023-04-11 | Discharge: 2023-04-11 | Disposition: A | Discharge status: Home / Self Care | Payer: Medicare Other | Source: Ambulatory Visit | Attending: Neurology | Admitting: Neurology

## 2023-04-11 DIAGNOSIS — M542 Cervicalgia: Secondary | ICD-10-CM

## 2023-04-11 NOTE — Progress Notes (Signed)
In response to coding query, viral panel ordered to see if patient had a testable URI.

## 2023-04-13 DIAGNOSIS — H401121 Primary open-angle glaucoma, left eye, mild stage: Secondary | ICD-10-CM | POA: Diagnosis not present

## 2023-04-14 ENCOUNTER — Ambulatory Visit: Payer: Medicare Other | Admitting: Physical Therapy

## 2023-04-14 ENCOUNTER — Encounter: Payer: Self-pay | Admitting: Neurology

## 2023-04-14 ENCOUNTER — Encounter: Payer: Self-pay | Admitting: Physical Therapy

## 2023-04-14 DIAGNOSIS — M542 Cervicalgia: Secondary | ICD-10-CM | POA: Diagnosis not present

## 2023-04-14 DIAGNOSIS — R252 Cramp and spasm: Secondary | ICD-10-CM | POA: Diagnosis not present

## 2023-04-14 NOTE — Therapy (Signed)
OUTPATIENT PHYSICAL THERAPY CERVICAL TREATMENT   Patient Name: Darren Ponce MRN: [id number] DOB:05/22/1950, 73 y.o., male Today's Date: 04/14/2023  END OF SESSION:  PT End of Session - 04/14/23 1020     Visit Number 6    Number of Visits 7   6+eval   Date for PT Re-Evaluation 05/01/23   pushed out due to scheduling delays   Authorization Type [employer] MEDICARE    PT Start Time 1016    PT Stop Time 1100    PT Time Calculation (min) 44 min    Activity Tolerance Patient tolerated treatment well    Behavior During Therapy [facility] for tasks assessed/performed             Past Medical History:  Diagnosis Date   Choledocholithiasis 09/02/2012   Glaucoma    Hypertension    Kidney stone    PONV (postoperative nausea and vomiting)    Past Surgical History:  Procedure Laterality Date   CARPAL TUNNEL RELEASE Right 06/24/2021   Procedure: RIGHT CARPAL TUNNEL RELEASE;  Surgeon: Betha Loa, MD;  Location: [facility];  Service: Orthopedics;  Laterality: Right;   CHOLECYSTECTOMY N/A 09/04/2012   Procedure: LAPAROSCOPIC CHOLECYSTECTOMY WITH INTRAOPERATIVE CHOLANGIOGRAM;  Surgeon: Kandis Cocking, MD;  Location: [facility];  Service: General;  Laterality: N/A;   ERCP N/A 09/03/2012   Procedure: ENDOSCOPIC RETROGRADE CHOLANGIOPANCREATOGRAPHY (ERCP);  Surgeon: Iva Boop, MD;  Location: [facility];  Service: Endoscopy;  Laterality: N/A;   HERNIA REPAIR     Patient Active Problem List   Diagnosis Date Noted   Choledocholithiasis 09/02/2012   Acute cholecystitis 09/01/2012   Accelerated hypertension 09/01/2012   Leukocytosis 09/01/2012   Abnormal LFTs 09/01/2012   Epigastric pain 09/01/2012    PCP: Deatra James, MD  REFERRING PROVIDER: Windell Norfolk, MD  REFERRING DIAG: M54.2 (ICD-10-CM) - Cervicalgia  THERAPY DIAG:  Cervicalgia  Cramp and spasm  Rationale for Evaluation and Treatment: Rehabilitation  ONSET DATE: June 2024  SUBJECTIVE:                                                                                                                                                                                                          SUBJECTIVE STATEMENT: Pt reports no falls or acute changes.  He reports his weekend was fine and his pain continues to fluctuate.  Pt reports he is doing some of his HEP, but not all of them - mostly the shoulder focused exercises. Hand dominance: Right  PERTINENT HISTORY:  R carpal tunnel release 2023, HTN  PAIN:  Are you having pain? Yes: NPRS scale: 1 Pain location: left posterior neck Pain description: sore Aggravating factors: moving Relieving factors: rest  PRECAUTIONS: Fall  RED FLAGS: None     WEIGHT BEARING RESTRICTIONS: No  FALLS:  Has patient fallen in last 6 months? Yes. Number of falls 1  LIVING ENVIRONMENT: Lives with: lives with their spouse Lives in: House/apartment Stairs: Yes: External: 1 STE steps; none Has following equipment at home: shower chair  OCCUPATION: Retired Naval architect; part-time recreational bus driver  PLOF: Independent  PATIENT GOALS: "I'd just love to figure out what this is.  I'd just love to have peace of mind to fix this if I can!"  NEXT MD VISIT: N/A  OBJECTIVE:  Note: Objective measures were completed at Evaluation unless otherwise noted.  DIAGNOSTIC FINDINGS:  MRI cervical spine ordered  PATIENT SURVEYS:  NDI 9/50 = 18% = low perceived disability  COGNITION: Overall cognitive status: Within functional limits for tasks assessed  SENSATION: WFL  POSTURE: rounded shoulders, forward head, and noted cervical flattening  PALPATION: No tenderness to any aspect of upper trap, cervical paraspinals, upper thoracic and midline c-spine   CERVICAL ROM:   Active ROM A/PROM (deg) eval  Flexion 30 degrees; stretch sensation in posterior neck  Extension 27 degrees  Right lateral flexion   Left lateral flexion   Right rotation 29 degrees  Left rotation 31  degrees; tightness bilaterally   (Blank rows = not tested)  UPPER EXTREMITY ROM:  Active ROM Right eval Left eval  Shoulder flexion Limited to 90 bilaterally Limited to 90 bilaterally  Shoulder extension    Shoulder abduction " "  Shoulder adduction    Shoulder extension    Shoulder internal rotation    Shoulder external rotation    Elbow flexion    Elbow extension    Wrist flexion    Wrist extension    Wrist ulnar deviation    Wrist radial deviation    Wrist pronation    Wrist supination     (Blank rows = not tested)  UPPER EXTREMITY MMT:  MMT Right eval Left eval  Shoulder flexion Not formally assessed.  Shoulder extension   Shoulder abduction   Shoulder adduction   Shoulder extension   Shoulder internal rotation   Shoulder external rotation   Middle trapezius   Lower trapezius   Elbow flexion   Elbow extension   Wrist flexion   Wrist extension   Wrist ulnar deviation   Wrist radial deviation   Wrist pronation   Wrist supination   Grip strength    (Blank rows = not tested)  CERVICAL SPECIAL TESTS:  Neck flexor muscle endurance test: To be assessed.  FUNCTIONAL TESTS:  5 times sit to stand: 13.42 sec no UE support  TODAY'S TREATMENT:  DATE: 04/14/2023 -STM to suboccipitals and cervical paraspinals; myofascial release and manual traction for pain modulation and mobility -10lb deadlift x12 > 15lb deadlift x12, more cues for thoracic and cervical postural corrections -Seated good mornings x15 > standing good mornings x12 -Farmer's Carry 10 lb 2x115' each UE > 15lb 2x115' each UE SBA, mild hiking and trunk lean on side of carry with 15lb weight  PATIENT EDUCATION:  Education details:  Discussed calling neurologist office to speak with them about MRI results and pt understanding.  Continue HEP to reinforce STM.  Continued to encourage  gradual increase in preferred activities at home.  Discussed lifting technique and safety.  Discussed plan for discharge following goal assessment next visit - process of obtaining new referral with status change in future. Person educated: Patient Education method: Explanation Education comprehension: verbalized understanding  HOME EXERCISE PROGRAM: Access Code: [email or web address] URL: [email or web address] Date: 04/06/2023 Prepared by: Camille Bal  Exercises - Supine Chin Tuck  - 1 x daily - 5 x weekly - 2 sets - 10 reps - 1-2 seconds hold - Supine Cervical Retraction with Towel  - 1 x daily - 5 x weekly - 2-3 sets - 10 reps - 2 seconds hold - Hooklying Suboccipital Release with Fingers  - 1 x daily - 5 x weekly - 3 sets - 10 reps - Seated Upper Trapezius Stretch  - 1 x daily - 5 x weekly - 1 sets - 2-3 reps - 45 - 60 seconds hold - Scapular retraction with resistance  - 1 x daily - 5 x weekly - 3 sets - 10 reps - Seated Shoulder Diagonal Pulls with Resistance  - 1 x daily - 5 x weekly - 3 sets - 10 reps - Seated Assisted Cervical Rotation with Towel  - 1 x daily - 5 x weekly - 2 sets - 10 reps - Mid-Lower Cervical Extension SNAG with Strap  - 1 x daily - 5 x weekly - 2 sets - 10 reps - Standing Lat Pull Down with Resistance - Elbows Bent  - 1 x daily - 5 x weekly - 3 sets - 10 reps  ASSESSMENT:  CLINICAL IMPRESSION: Focus of skilled session today on addressing lifting technique and general trunk posture with lifting and carrying style activities.  He has some minor rounding of thoracic spine and trunk lean with these tasks, but is mindful of cuing and able to demonstrate some corrections.  He has overall made great strides in his pain management at home and is appropriate for goal assessment and likely discharge next session.  Will continue per POC.  OBJECTIVE IMPAIRMENTS: decreased ROM, impaired flexibility, and pain.   ACTIVITY LIMITATIONS: reach over head  PARTICIPATION  LIMITATIONS: community activity, occupation, and yard work  PERSONAL FACTORS: Age and 1 comorbidity: HTN  are also affecting patient's functional outcome.   REHAB POTENTIAL: Excellent  CLINICAL DECISION MAKING: Stable/uncomplicated  EVALUATION COMPLEXITY: Low   GOALS: Goals reviewed with patient? Yes  SHORT TERM GOALS: Target date: 04/03/2023  Pt will be independent and compliant with initial cervical stretching and strengthening HEP in order to maintain functional progress and improve mobility. Baseline: To be established. Goal status: INITIAL  LONG TERM GOALS: Target date: 04/24/2023  Pt will be independent and compliant with advanced cervical stretching and strengthening HEP in order to maintain functional progress and improve mobility. Baseline: To be established. Goal status: INITIAL  2.  Pt will tolerate deep neck flexor endurance test x30 seconds in order to improve appropriate  muscular coordination and posture of the cervical spine. Baseline: 7.79 sec (12/24) Goal status: INITIAL  3.  Pt will improve NDI score to </=2/50 in order to demonstrate improved pain management and quality of life. Baseline: 9/50 = 18% Goal status: INITIAL  PLAN:  PT FREQUENCY: 1x/week  PT DURATION: 6 weeks  PLANNED INTERVENTIONS: 97164- PT Re-evaluation, 97110-Therapeutic exercises, 97530- Therapeutic activity, 97112- Neuromuscular re-education, 97535- Self Care, [id number]- Manual therapy, Patient/Family education, Taping, Dry Needling, Joint mobilization, Spinal mobilization, Cryotherapy, and Moist heat  PLAN FOR NEXT SESSION: ASSESS LTGs - Discharge!  Sadie Haber, PT, DPT 04/14/2023, 11:06 AM

## 2023-04-20 ENCOUNTER — Encounter: Payer: Self-pay | Admitting: Physical Therapy

## 2023-04-20 ENCOUNTER — Ambulatory Visit: Payer: Medicare Other | Admitting: Physical Therapy

## 2023-04-20 DIAGNOSIS — M542 Cervicalgia: Secondary | ICD-10-CM

## 2023-04-20 DIAGNOSIS — R252 Cramp and spasm: Secondary | ICD-10-CM | POA: Diagnosis not present

## 2023-04-20 NOTE — Therapy (Signed)
OUTPATIENT PHYSICAL THERAPY CERVICAL TREATMENT - DISCHARGE SUMMARY   Patient Name: Darren Ponce MRN: [id number] DOB:January 23, 1951, 73 y.o., male Today's Date: 04/20/2023  PHYSICAL THERAPY DISCHARGE SUMMARY  Visits from Start of Care: 7  Current functional level related to goals / functional outcomes: See clinical impression statement.   Remaining deficits: Mild neck stiffness/soreness (1-2/10)   Education / Equipment: Discussed pt's options for neurosurgery referral from neurologist as pt inquires if he should follow through - encouraged pt to consider symptoms/changes like N/T and pain severity and possible options a neurosurgical relationship could offer for pain management and function in future if not currently pt's preference.  Brief discussion of anatomy and how stenosis can impact nerves/function over time and signs/symptoms to consider.   Discussed modifications to lifting things onto back of bus - using makeshift ramp to push/pull vs bumping coolers onto step stool or ladder to decrease height of lift vs finding a +2.  Continue HEP to reinforce STM.  Discussed moving low and slow with return to weight lifting as he recently has at home without issue.   Patient agrees to discharge. Patient goals were partially met. Patient is being discharged due to being pleased with the current functional level.   END OF SESSION:  PT End of Session - 04/20/23 0936     Visit Number 7    Number of Visits 7   6+eval   Date for PT Re-Evaluation 05/01/23   pushed out due to scheduling delays   Authorization Type [employer] MEDICARE    PT Start Time [phone]    Activity Tolerance Patient tolerated treatment well    Behavior During Therapy [facility] for tasks assessed/performed             Past Medical History:  Diagnosis Date   Choledocholithiasis 09/02/2012   Glaucoma    Hypertension    Kidney stone    PONV (postoperative nausea and vomiting)    Past Surgical History:  Procedure Laterality Date    CARPAL TUNNEL RELEASE Right 06/24/2021   Procedure: RIGHT CARPAL TUNNEL RELEASE;  Surgeon: Betha Loa, MD;  Location: [facility];  Service: Orthopedics;  Laterality: Right;   CHOLECYSTECTOMY N/A 09/04/2012   Procedure: LAPAROSCOPIC CHOLECYSTECTOMY WITH INTRAOPERATIVE CHOLANGIOGRAM;  Surgeon: Kandis Cocking, MD;  Location: [facility];  Service: General;  Laterality: N/A;   ERCP N/A 09/03/2012   Procedure: ENDOSCOPIC RETROGRADE CHOLANGIOPANCREATOGRAPHY (ERCP);  Surgeon: Iva Boop, MD;  Location: [facility];  Service: Endoscopy;  Laterality: N/A;   HERNIA REPAIR     Patient Active Problem List   Diagnosis Date Noted   Choledocholithiasis 09/02/2012   Acute cholecystitis 09/01/2012   Accelerated hypertension 09/01/2012   Leukocytosis 09/01/2012   Abnormal LFTs 09/01/2012   Epigastric pain 09/01/2012    PCP: Deatra James, MD  REFERRING PROVIDER: Windell Norfolk, MD  REFERRING DIAG: M54.2 (ICD-10-CM) - Cervicalgia  THERAPY DIAG:  Cervicalgia  Cramp and spasm  Rationale for Evaluation and Treatment: Rehabilitation  ONSET DATE: June 2024  SUBJECTIVE:  SUBJECTIVE STATEMENT: Pt reports no falls or acute changes.  He reports some very mild left cervical stiffness.  He states he is overall feeling good and feels like his neck is doing well. Hand dominance: Right  PERTINENT HISTORY:  R carpal tunnel release 2023, HTN  PAIN:  Are you having pain? Yes: NPRS scale: 1-2 Pain location: left posterior neck Pain description: sore/stiff Aggravating factors: moving Relieving factors: rest  PRECAUTIONS: Fall  RED FLAGS: None     WEIGHT BEARING RESTRICTIONS: No  FALLS:  Has patient fallen in last 6 months? Yes. Number of falls 1  LIVING ENVIRONMENT: Lives with: lives  with their spouse Lives in: House/apartment Stairs: Yes: External: 1 STE steps; none Has following equipment at home: shower chair  OCCUPATION: Retired Naval architect; part-time recreational bus driver  PLOF: Independent  PATIENT GOALS: "I'd just love to figure out what this is.  I'd just love to have peace of mind to fix this if I can!"  NEXT MD VISIT: N/A  OBJECTIVE:  Note: Objective measures were completed at Evaluation unless otherwise noted.  DIAGNOSTIC FINDINGS:  MRI cervical spine ordered  PATIENT SURVEYS:  NDI 9/50 = 18% = low perceived disability  COGNITION: Overall cognitive status: Within functional limits for tasks assessed  SENSATION: WFL  POSTURE: rounded shoulders, forward head, and noted cervical flattening  PALPATION: No tenderness to any aspect of upper trap, cervical paraspinals, upper thoracic and midline c-spine   CERVICAL ROM:   Active ROM A/PROM (deg) eval  Flexion 30 degrees; stretch sensation in posterior neck  Extension 27 degrees  Right lateral flexion   Left lateral flexion   Right rotation 29 degrees  Left rotation 31 degrees; tightness bilaterally   (Blank rows = not tested)  UPPER EXTREMITY ROM:  Active ROM Right eval Left eval  Shoulder flexion Limited to 90 bilaterally Limited to 90 bilaterally  Shoulder extension    Shoulder abduction " "  Shoulder adduction    Shoulder extension    Shoulder internal rotation    Shoulder external rotation    Elbow flexion    Elbow extension    Wrist flexion    Wrist extension    Wrist ulnar deviation    Wrist radial deviation    Wrist pronation    Wrist supination     (Blank rows = not tested)  UPPER EXTREMITY MMT:  MMT Right eval Left eval  Shoulder flexion Not formally assessed.  Shoulder extension   Shoulder abduction   Shoulder adduction   Shoulder extension   Shoulder internal rotation   Shoulder external rotation   Middle trapezius   Lower trapezius   Elbow flexion    Elbow extension   Wrist flexion   Wrist extension   Wrist ulnar deviation   Wrist radial deviation   Wrist pronation   Wrist supination   Grip strength    (Blank rows = not tested)  CERVICAL SPECIAL TESTS:  Neck flexor muscle endurance test: To be assessed.  FUNCTIONAL TESTS:  5 times sit to stand: 13.42 sec no UE support  TODAY'S TREATMENT:  DATE: 04/20/2023 -Verbally reviewed HEP and confirmed pt has current copy, he is able to verbalize the exercises he does regularly.  Deep neck flexor endurance test (provided 1 minute rest between to accommodate muscle tension) -Trial 1:  7.94 seconds -Trial 2:  21.68 seconds -Trial 3:  24.47 seconds -AVERAGE:  18.03 seconds  -NDI:  2/50 = 4% = low perceived disability; PT answers pt questions about interpretation of questions as needed.  PATIENT EDUCATION:  Education details:  Discussed pt's options for neurosurgery referral from neurologist as pt inquires if he should follow through - encouraged pt to consider symptoms/changes like N/T and pain severity and possible options a neurosurgical relationship could offer for pain management and function in future if not currently pt's preference.  Brief discussion of anatomy and how stenosis can impact nerves/function over time and signs/symptoms to consider.   Discussed modifications to lifting things onto back of bus - using makeshift ramp to push/pull vs bumping coolers onto step stool or ladder to decrease height of lift vs finding a +2.  Continue HEP to reinforce STM.  Discussed moving low and slow with return to weight lifting as he recently has at home without issue. Person educated: Patient Education method: Explanation Education comprehension: verbalized understanding  HOME EXERCISE PROGRAM: Access Code: [email or web address] URL: [email or web address] Date:  04/06/2023 Prepared by: Camille Bal  Exercises - Supine Chin Tuck  - 1 x daily - 5 x weekly - 2 sets - 10 reps - 1-2 seconds hold - Supine Cervical Retraction with Towel  - 1 x daily - 5 x weekly - 2-3 sets - 10 reps - 2 seconds hold - Hooklying Suboccipital Release with Fingers  - 1 x daily - 5 x weekly - 3 sets - 10 reps - Seated Upper Trapezius Stretch  - 1 x daily - 5 x weekly - 1 sets - 2-3 reps - 45 - 60 seconds hold - Scapular retraction with resistance  - 1 x daily - 5 x weekly - 3 sets - 10 reps - Seated Shoulder Diagonal Pulls with Resistance  - 1 x daily - 5 x weekly - 3 sets - 10 reps - Seated Assisted Cervical Rotation with Towel  - 1 x daily - 5 x weekly - 2 sets - 10 reps - Mid-Lower Cervical Extension SNAG with Strap  - 1 x daily - 5 x weekly - 2 sets - 10 reps - Standing Lat Pull Down with Resistance - Elbows Bent  - 1 x daily - 5 x weekly - 3 sets - 10 reps  ASSESSMENT:  CLINICAL IMPRESSION: Assessed LTGs this visit with patient meeting or partially meeting 3 of 3 goals as written.  His neck pain and quality of life appear much improved based on score improvement from 9 to 2/50 on NDI.  He has some mild ongoing tightness in the neck, but reports he feels comfortable managing this with techniques taught in sessions prior.  He greatly improved his deep neck flexor endurance to an average of 18.03 seconds from roughly 7 seconds on evaluation.  Overall, he has the tools and motivation to manage and maintain current state of function and is in agreement to plan to discharge this visit.  He is to return with a new referral should a status change occur.  OBJECTIVE IMPAIRMENTS: decreased ROM, impaired flexibility, and pain.   ACTIVITY LIMITATIONS: reach over head  PARTICIPATION LIMITATIONS: community activity, occupation, and yard work  PERSONAL FACTORS: Age and 1 comorbidity: HTN  are  also affecting patient's functional outcome.   REHAB POTENTIAL: Excellent  CLINICAL  DECISION MAKING: Stable/uncomplicated  EVALUATION COMPLEXITY: Low   GOALS: Goals reviewed with patient? Yes  SHORT TERM GOALS: Target date: 04/03/2023  Pt will be independent and compliant with initial cervical stretching and strengthening HEP in order to maintain functional progress and improve mobility. Baseline: To be established. Goal status: INITIAL  LONG TERM GOALS: Target date: 04/24/2023  Pt will be independent and compliant with advanced cervical stretching and strengthening HEP in order to maintain functional progress and improve mobility. Baseline: Established and pt ind and compliant (1/27) Goal status: MET  2.  Pt will tolerate deep neck flexor endurance test x30 seconds in order to improve appropriate muscular coordination and posture of the cervical spine. Baseline: 7.79 sec (12/24); 18.03 seconds (1/27) Goal status: PARTIALLY MET  3.  Pt will improve NDI score to </=2/50 in order to demonstrate improved pain management and quality of life. Baseline: 9/50 = 18%; 2/50 = 4% = low perceived disability (1/27) Goal status: MET  PLAN:  PT FREQUENCY: 1x/week  PT DURATION: 6 weeks  PLANNED INTERVENTIONS: 97164- PT Re-evaluation, 97110-Therapeutic exercises, 97530- Therapeutic activity, 97112- Neuromuscular re-education, 97535- Self Care, 59563- Manual therapy, Patient/Family education, Taping, Dry Needling, Joint mobilization, Spinal mobilization, Cryotherapy, and Moist heat  PLAN FOR NEXT SESSION: N/A  Sadie Haber, PT, DPT 04/20/2023, 9:37 AM

## 2023-04-22 DIAGNOSIS — R21 Rash and other nonspecific skin eruption: Secondary | ICD-10-CM | POA: Diagnosis not present

## 2023-04-22 DIAGNOSIS — K589 Irritable bowel syndrome without diarrhea: Secondary | ICD-10-CM | POA: Diagnosis not present

## 2023-04-22 DIAGNOSIS — R7303 Prediabetes: Secondary | ICD-10-CM | POA: Diagnosis not present

## 2023-04-22 DIAGNOSIS — N1831 Chronic kidney disease, stage 3a: Secondary | ICD-10-CM | POA: Diagnosis not present

## 2023-04-22 DIAGNOSIS — I700 Atherosclerosis of aorta: Secondary | ICD-10-CM | POA: Diagnosis not present

## 2023-04-22 DIAGNOSIS — I6529 Occlusion and stenosis of unspecified carotid artery: Secondary | ICD-10-CM | POA: Diagnosis not present

## 2023-04-22 DIAGNOSIS — Z0000 Encounter for general adult medical examination without abnormal findings: Secondary | ICD-10-CM | POA: Diagnosis not present

## 2023-04-22 DIAGNOSIS — I129 Hypertensive chronic kidney disease with stage 1 through stage 4 chronic kidney disease, or unspecified chronic kidney disease: Secondary | ICD-10-CM | POA: Diagnosis not present

## 2023-07-24 DIAGNOSIS — R051 Acute cough: Secondary | ICD-10-CM | POA: Diagnosis not present

## 2023-07-24 DIAGNOSIS — J069 Acute upper respiratory infection, unspecified: Secondary | ICD-10-CM | POA: Diagnosis not present

## 2023-07-24 DIAGNOSIS — H6123 Impacted cerumen, bilateral: Secondary | ICD-10-CM | POA: Diagnosis not present

## 2023-09-07 DIAGNOSIS — L570 Actinic keratosis: Secondary | ICD-10-CM | POA: Diagnosis not present

## 2023-09-07 DIAGNOSIS — D492 Neoplasm of unspecified behavior of bone, soft tissue, and skin: Secondary | ICD-10-CM | POA: Diagnosis not present

## 2023-09-07 DIAGNOSIS — D044 Carcinoma in situ of skin of scalp and neck: Secondary | ICD-10-CM | POA: Diagnosis not present

## 2023-09-17 DIAGNOSIS — H401121 Primary open-angle glaucoma, left eye, mild stage: Secondary | ICD-10-CM | POA: Diagnosis not present

## 2023-09-22 DIAGNOSIS — L578 Other skin changes due to chronic exposure to nonionizing radiation: Secondary | ICD-10-CM | POA: Diagnosis not present

## 2023-09-22 DIAGNOSIS — L821 Other seborrheic keratosis: Secondary | ICD-10-CM | POA: Diagnosis not present

## 2023-09-22 DIAGNOSIS — L570 Actinic keratosis: Secondary | ICD-10-CM | POA: Diagnosis not present

## 2023-10-14 DIAGNOSIS — D044 Carcinoma in situ of skin of scalp and neck: Secondary | ICD-10-CM | POA: Diagnosis not present

## 2023-10-20 DIAGNOSIS — I6529 Occlusion and stenosis of unspecified carotid artery: Secondary | ICD-10-CM | POA: Diagnosis not present

## 2023-10-20 DIAGNOSIS — I129 Hypertensive chronic kidney disease with stage 1 through stage 4 chronic kidney disease, or unspecified chronic kidney disease: Secondary | ICD-10-CM | POA: Diagnosis not present

## 2023-10-20 DIAGNOSIS — N1831 Chronic kidney disease, stage 3a: Secondary | ICD-10-CM | POA: Diagnosis not present

## 2023-10-20 DIAGNOSIS — I700 Atherosclerosis of aorta: Secondary | ICD-10-CM | POA: Diagnosis not present

## 2023-11-15 DIAGNOSIS — M109 Gout, unspecified: Secondary | ICD-10-CM | POA: Diagnosis not present

## 2023-12-16 DIAGNOSIS — L821 Other seborrheic keratosis: Secondary | ICD-10-CM | POA: Diagnosis not present

## 2023-12-16 DIAGNOSIS — Z08 Encounter for follow-up examination after completed treatment for malignant neoplasm: Secondary | ICD-10-CM | POA: Diagnosis not present

## 2023-12-16 DIAGNOSIS — Z85828 Personal history of other malignant neoplasm of skin: Secondary | ICD-10-CM | POA: Diagnosis not present

## 2023-12-16 DIAGNOSIS — L570 Actinic keratosis: Secondary | ICD-10-CM | POA: Diagnosis not present

## 2024-03-05 ENCOUNTER — Ambulatory Visit: Admission: EM | Admit: 2024-03-05 | Discharge: 2024-03-05 | Disposition: A | Discharge status: Home / Self Care

## 2024-03-05 ENCOUNTER — Encounter: Payer: Self-pay | Admitting: *Deleted

## 2024-03-05 DIAGNOSIS — R053 Chronic cough: Secondary | ICD-10-CM

## 2024-03-05 MED ORDER — BENZONATATE 100 MG PO CAPS: 100.0000 mg | ORAL_CAPSULE | Freq: Three times a day (TID) | ORAL | 0 refills | Status: DC

## 2024-03-05 MED ORDER — PROMETHAZINE-DM 6.25-15 MG/5ML PO SYRP: 5.0000 mL | ORAL_SOLUTION | Freq: Four times a day (QID) | ORAL | 0 refills | Status: DC | PRN

## 2024-03-05 NOTE — ED Triage Notes (Signed)
 Pt reports cough x 4 weeks. Pt reports he has been dealing with persistent cough for over a month. His PCP treated him with doxycycline , zithromax, and prednisone. States his symptoms are improving but haven't resolved. He is also using inhaler when needed. Still has cough but can't get mucous out

## 2024-03-05 NOTE — ED Provider Notes (Signed)
 [facility]    CSN: [id number] Arrival date & time: 03/05/24  1206      History   Chief Complaint Chief Complaint  Patient presents with   Cough    HPI Darren Ponce is a 73 y.o. male.   Pt presents today due to persistent cough with scant purulent drainage. Pt states that he has been prescribed 2 courses of antibiotics and steroids and states that he feels much better than he did, states that cough is lingering. Pt denies fever, chills, nausea, or change in appetite.   The history is provided by the patient.  Cough   Past Medical History:  Diagnosis Date   Choledocholithiasis 09/02/2012   Glaucoma    Hypertension    Kidney stone    PONV (postoperative nausea and vomiting)     Patient Active Problem List   Diagnosis Date Noted   Choledocholithiasis 09/02/2012   Acute cholecystitis 09/01/2012   Accelerated hypertension 09/01/2012   Leukocytosis 09/01/2012   Abnormal LFTs 09/01/2012   Epigastric pain 09/01/2012    Past Surgical History:  Procedure Laterality Date   CARPAL TUNNEL RELEASE Right 06/24/2021   Procedure: RIGHT CARPAL TUNNEL RELEASE;  Surgeon: Murrell Drivers, MD;  Location: [facility];  Service: Orthopedics;  Laterality: Right;   CHOLECYSTECTOMY N/A 09/04/2012   Procedure: LAPAROSCOPIC CHOLECYSTECTOMY WITH INTRAOPERATIVE CHOLANGIOGRAM;  Surgeon: Alm VEAR Angle, MD;  Location: [facility];  Service: General;  Laterality: N/A;   ERCP N/A 09/03/2012   Procedure: ENDOSCOPIC RETROGRADE CHOLANGIOPANCREATOGRAPHY (ERCP);  Surgeon: Lupita FORBES Commander, MD;  Location: [facility];  Service: Endoscopy;  Laterality: N/A;   HERNIA REPAIR         Home Medications    Prior to Admission medications  Medication Sig Start Date End Date Taking? Authorizing Provider  albuterol  (VENTOLIN  HFA) 108 (90 Base) MCG/ACT inhaler Inhale 2 puffs into the lungs every 4 (four) hours as needed. 02/22/24  Yes [provider]  benzonatate  (TESSALON ) 100 MG  capsule Take 1 capsule (100 mg total) by mouth every 8 (eight) hours. 03/05/24  Yes Andra Krabbe C, PA-C  latanoprost (XALATAN) 0.005 % ophthalmic solution Place 1 drop into the left eye daily before breakfast. 04/29/19  Yes [provider]  lisinopril  (ZESTRIL ) 5 MG tablet Take 1 tablet (5 mg total) by mouth at bedtime. 05/13/19  Yes Petrucelli, Samantha R, PA-C  pravastatin (PRAVACHOL) 10 MG tablet Take 10 mg by mouth daily. 12/29/23  Yes [provider]  promethazine -dextromethorphan (PROMETHAZINE -DM) 6.25-15 MG/5ML syrup Take 5 mLs by mouth 4 (four) times daily as needed. 03/05/24  Yes Andra Krabbe C, PA-C  acetaminophen  (TYLENOL ) 500 MG tablet Take 1,000 mg by mouth every 6 (six) hours as needed for pain.    [provider]  sildenafil (VIAGRA) 100 MG tablet Take 100 mg by mouth daily as needed. 03/12/23   [provider]    Family History History reviewed. No pertinent family history.  Social History Social History[1]   Allergies   Codeine   Review of Systems Review of Systems  Respiratory:  Positive for cough.      Physical Exam Triage Vital Signs ED Triage Vitals  Encounter Vitals Group     BP 03/05/24 1228 113/77     Girls Systolic BP Percentile --      Girls Diastolic BP Percentile --      Boys Systolic BP Percentile --      Boys Diastolic BP Percentile --  Pulse Rate 03/05/24 1228 61     Resp 03/05/24 1228 18     Temp 03/05/24 1228 98.1 F (36.7 C)     Temp Source 03/05/24 1228 Oral     SpO2 03/05/24 1228 96 %     Weight --      Height --      Head Circumference --      Peak Flow --      Pain Score 03/05/24 1232 0     Pain Loc --      Pain Education --      Exclude from Growth Chart --    No data found.  Updated Vital Signs BP 113/77 (BP Location: Left Arm)   Pulse 61   Temp 98.1 F (36.7 C) (Oral)   Resp 18   SpO2 96%   Visual Acuity Right Eye Distance:   Left Eye Distance:   Bilateral  Distance:    Right Eye Near:   Left Eye Near:    Bilateral Near:     Physical Exam Vitals and nursing note reviewed.  Constitutional:      General: He is not in acute distress.    Appearance: Normal appearance. He is not ill-appearing, toxic-appearing or diaphoretic.  Eyes:     General: No scleral icterus. Cardiovascular:     Rate and Rhythm: Normal rate and regular rhythm.     Heart sounds: Normal heart sounds.  Pulmonary:     Effort: Pulmonary effort is normal. No respiratory distress.     Breath sounds: Normal breath sounds. No wheezing or rhonchi.  Skin:    General: Skin is warm.  Neurological:     Mental Status: He is alert and oriented to person, place, and time.  Psychiatric:        Mood and Affect: Mood normal.        Behavior: Behavior normal.      UC Treatments / Results  Labs (all labs ordered are listed, but only abnormal results are displayed) Labs Reviewed - No data to display  EKG   Radiology No results found.  Procedures Procedures (including critical care time)  Medications Ordered in UC Medications - No data to display  Initial Impression / Assessment and Plan / UC Course  I have reviewed the triage vital signs and the nursing notes.  Pertinent labs & imaging results that were available during my care of the patient were reviewed by me and considered in my medical decision making (see chart for details).     Final Clinical Impressions(s) / UC Diagnoses   Final diagnoses:  Persistent cough   Discharge Instructions   None    ED Prescriptions     Medication Sig Dispense Auth. Provider   benzonatate  (TESSALON ) 100 MG capsule Take 1 capsule (100 mg total) by mouth every 8 (eight) hours. 30 capsule Andra Krabbe C, PA-C   promethazine -dextromethorphan (PROMETHAZINE -DM) 6.25-15 MG/5ML syrup Take 5 mLs by mouth 4 (four) times daily as needed. 118 mL Andra Krabbe BROCKS, PA-C      PDMP not reviewed this encounter.    [1]   Social History Tobacco Use   Smoking status: Never   Smokeless tobacco: Never  Vaping Use   Vaping status: Never Used  Substance Use Topics   Alcohol use: Yes    Comment: occasionally   Drug use: No     Andra Krabbe BROCKS, PA-C 03/05/24 1343

## 2024-04-05 ENCOUNTER — Emergency Department (HOSPITAL_COMMUNITY)

## 2024-04-05 ENCOUNTER — Observation Stay (HOSPITAL_COMMUNITY)
Admission: EM | Admit: 2024-04-05 | Discharge: 2024-04-06 | Disposition: A | Discharge status: Home / Self Care | Source: Ambulatory Visit | Attending: Internal Medicine | Admitting: Internal Medicine

## 2024-04-05 ENCOUNTER — Other Ambulatory Visit: Payer: Self-pay

## 2024-04-05 DIAGNOSIS — Z79899 Other long term (current) drug therapy: Secondary | ICD-10-CM | POA: Insufficient documentation

## 2024-04-05 DIAGNOSIS — R079 Chest pain, unspecified: Secondary | ICD-10-CM

## 2024-04-05 DIAGNOSIS — R0602 Shortness of breath: Secondary | ICD-10-CM | POA: Diagnosis not present

## 2024-04-05 DIAGNOSIS — I10 Essential (primary) hypertension: Secondary | ICD-10-CM | POA: Insufficient documentation

## 2024-04-05 DIAGNOSIS — I358 Other nonrheumatic aortic valve disorders: Secondary | ICD-10-CM | POA: Diagnosis not present

## 2024-04-05 DIAGNOSIS — R0609 Other forms of dyspnea: Secondary | ICD-10-CM

## 2024-04-05 DIAGNOSIS — R072 Precordial pain: Secondary | ICD-10-CM | POA: Diagnosis not present

## 2024-04-05 DIAGNOSIS — B349 Viral infection, unspecified: Secondary | ICD-10-CM | POA: Insufficient documentation

## 2024-04-05 DIAGNOSIS — E785 Hyperlipidemia, unspecified: Secondary | ICD-10-CM | POA: Insufficient documentation

## 2024-04-05 DIAGNOSIS — M79603 Pain in arm, unspecified: Secondary | ICD-10-CM | POA: Insufficient documentation

## 2024-04-05 DIAGNOSIS — K838 Other specified diseases of biliary tract: Secondary | ICD-10-CM | POA: Insufficient documentation

## 2024-04-05 LAB — ECHOCARDIOGRAM COMPLETE
Area-P 1/2: 3.56 cm2
Height: 70 in
S' Lateral: 2.9 cm
Weight: 2944 [oz_av]

## 2024-04-05 LAB — CBC
HCT: 42.5 % (ref 39.0–52.0)
Hemoglobin: 15.1 g/dL (ref 13.0–17.0)
MCH: 32.9 pg (ref 26.0–34.0)
MCHC: 35.5 g/dL (ref 30.0–36.0)
MCV: 92.6 fL (ref 80.0–100.0)
Platelets: 166 K/uL (ref 150–400)
RBC: 4.59 MIL/uL (ref 4.22–5.81)
RDW: 13.1 % (ref 11.5–15.5)
WBC: 6.6 K/uL (ref 4.0–10.5)
nRBC: 0 % (ref 0.0–0.2)

## 2024-04-05 LAB — BASIC METABOLIC PANEL WITH GFR
Anion gap: 8 (ref 5–15)
BUN: 13 mg/dL (ref 8–23)
CO2: 27 mmol/L (ref 22–32)
Calcium: 9.5 mg/dL (ref 8.9–10.3)
Chloride: 104 mmol/L (ref 98–111)
Creatinine, Ser: 1.08 mg/dL (ref 0.61–1.24)
GFR, Estimated: >60 mL/min
Glucose, Bld: 105 mg/dL — ABNORMAL HIGH (ref 70–99)
Potassium: 4.3 mmol/L (ref 3.5–5.1)
Sodium: 140 mmol/L (ref 135–145)

## 2024-04-05 LAB — HEPATIC FUNCTION PANEL
ALT: 8 U/L (ref 0–44)
AST: 14 U/L — ABNORMAL LOW (ref 15–41)
Albumin: 3.2 g/dL — ABNORMAL LOW (ref 3.5–5.0)
Alkaline Phosphatase: 35 U/L — ABNORMAL LOW (ref 38–126)
Bilirubin, Direct: 0.2 mg/dL (ref 0.0–0.2)
Indirect Bilirubin: 0.3 mg/dL (ref 0.3–0.9)
Total Bilirubin: 0.4 mg/dL (ref 0.0–1.2)
Total Protein: 4.6 g/dL — ABNORMAL LOW (ref 6.5–8.1)

## 2024-04-05 LAB — C-REACTIVE PROTEIN: CRP: <0.5 mg/dL

## 2024-04-05 LAB — TROPONIN T, HIGH SENSITIVITY
Troponin T High Sensitivity: <15 ng/L (ref 0–19)
Troponin T High Sensitivity: <15 ng/L (ref 0–19)

## 2024-04-05 LAB — HEMOGLOBIN A1C
Hgb A1c MFr Bld: 5.7 % — ABNORMAL HIGH (ref 4.8–5.6)
Mean Plasma Glucose: 116.89 mg/dL

## 2024-04-05 LAB — SEDIMENTATION RATE: Sed Rate: 1 mm/h (ref 0–16)

## 2024-04-05 MED ORDER — KETOROLAC TROMETHAMINE 15 MG/ML IJ SOLN: 7.5000 mg | Freq: Three times a day (TID) | INTRAMUSCULAR | Status: DC | PRN

## 2024-04-05 MED ORDER — METHOCARBAMOL 500 MG PO TABS
500.0000 mg | ORAL_TABLET | Freq: Three times a day (TID) | ORAL | Status: DC | PRN
  Administered 2024-04-05: 500 mg via ORAL
  Filled 2024-04-05: qty 1

## 2024-04-05 MED ORDER — ACETAMINOPHEN 325 MG PO TABS: 650.0000 mg | ORAL_TABLET | ORAL | Status: DC | PRN

## 2024-04-05 MED ORDER — PANTOPRAZOLE SODIUM 40 MG PO TBEC
40.0000 mg | DELAYED_RELEASE_TABLET | Freq: Every day | ORAL | Status: DC
  Administered 2024-04-06: 40 mg via ORAL
  Filled 2024-04-05: qty 1

## 2024-04-05 MED ORDER — IOHEXOL 350 MG/ML SOLN
100.0000 mL | Freq: Once | INTRAVENOUS | Status: AC | PRN
  Administered 2024-04-05: 100 mL via INTRAVENOUS

## 2024-04-05 MED ORDER — PERFLUTREN LIPID MICROSPHERE
1.0000 mL | INTRAVENOUS | Status: AC | PRN
  Administered 2024-04-05: 3 mL via INTRAVENOUS

## 2024-04-05 MED ORDER — BISMUTH SUBSALICYLATE 262 MG PO CHEW
262.0000 mg | CHEWABLE_TABLET | Freq: Four times a day (QID) | ORAL | Status: DC | PRN
  Administered 2024-04-05 – 2024-04-06 (×2): 262 mg via ORAL
  Filled 2024-04-05 (×2): qty 1

## 2024-04-05 MED ORDER — ONDANSETRON HCL 4 MG/2ML IJ SOLN: 4.0000 mg | Freq: Four times a day (QID) | INTRAMUSCULAR | Status: DC | PRN

## 2024-04-05 NOTE — Consult Note (Addendum)
 "  Cardiology Consultation   Patient ID: Darren Ponce MRN: [id number]; DOB: Jan 24, 1951  Admit date: 04/05/2024 Date of Consult: 04/05/2024  PCP:  Sun, Vyvyan, MD   [facility] Providers Cardiologist:  Darren [name], MD      Assessment and Plan: Darren Ponce is a 74 y.o. male with a hx of hypertension, glaucoma, carotid artery stenosis, and choledocholithiasis who is being seen 04/05/2024 for the evaluation of chest pain at the request of Sid Boning MD.  History of Present Illness: Darren Ponce is a 74 year old male who per chart review has not previously been seen by cardiology.  On interview patient reported that he had a cough, chills, and other flulike symptoms that started about 6 weeks ago.  Stated that his cough went away about a week or 2 ago.  Has had chest pain, shortness of breath, and dyspnea on exertion for the past month.  Reported that he had chest pain that woke him up out of his sleep and right arm numbness.  Patient feels like this pain is similar to the pain that he has had for the past month.  The patient has difficulty identifying things that provoke or improve his chest pain.  Feels like sitting up may help the chest discomfort.  Patient was previously able to go for mile walks and walk around large grocery stores.  He has not been able to do these activities recently because of shortness of breath. Denies any prior history of nicotine use, tobacco use, alcohol use, illicit substance use.  Denies any significant family history.  Labs showed negative high-sensitivity troponin x 2, creatinine of 1.08, potassium of 4.3, BUN of 13, WBC count of 6.6, and hemoglobin of 15.1.  EKG showed normal sinus rhythm with a rate of 64, abnormal R wave progression, and minimal ST elevation in V2 and V3 not meeting criteria for STEMI.  Chest x-ray showed no acute process.  CT angiography of chest abdomen and pelvis showed no evidence of thoracic or abdominal aortic  aneurysm, a small nonobstructive left renal calculus, and left hepatic pneumobilia    Past Medical History:  Diagnosis Date   Choledocholithiasis 09/02/2012   Glaucoma    Hypertension    Kidney stone    PONV (postoperative nausea and vomiting)     Past Surgical History:  Procedure Laterality Date   CARPAL TUNNEL RELEASE Right 06/24/2021   Procedure: RIGHT CARPAL TUNNEL RELEASE;  Surgeon: Murrell Drivers, MD;  Location: [facility];  Service: Orthopedics;  Laterality: Right;   CHOLECYSTECTOMY N/A 09/04/2012   Procedure: LAPAROSCOPIC CHOLECYSTECTOMY WITH INTRAOPERATIVE CHOLANGIOGRAM;  Surgeon: Alm VEAR Angle, MD;  Location: [facility];  Service: General;  Laterality: N/A;   ERCP N/A 09/03/2012   Procedure: ENDOSCOPIC RETROGRADE CHOLANGIOPANCREATOGRAPHY (ERCP);  Surgeon: Lupita FORBES Commander, MD;  Location: [facility];  Service: Endoscopy;  Laterality: N/A;   HERNIA REPAIR       Home Medications:  Prior to Admission medications  Medication Sig Start Date End Date Taking? Authorizing Provider  acetaminophen  (TYLENOL ) 500 MG tablet Take 1,000 mg by mouth every 6 (six) hours as needed for pain.    [provider]  albuterol  (VENTOLIN  HFA) 108 (90 Base) MCG/ACT inhaler Inhale 2 puffs into the lungs every 4 (four) hours as needed. 02/22/24   [provider]  benzonatate  (TESSALON ) 100 MG capsule Take 1 capsule (100 mg total) by mouth every 8 (eight) hours. 03/05/24   Andra Corean BROCKS, PA-C  latanoprost  (XALATAN ) 0.005 %  ophthalmic solution Place 1 drop into the left eye daily before breakfast. 04/29/19   [provider]  lisinopril  (ZESTRIL ) 5 MG tablet Take 1 tablet (5 mg total) by mouth at bedtime. 05/13/19   Petrucelli, Samantha R, PA-C  pravastatin  (PRAVACHOL ) 10 MG tablet Take 10 mg by mouth daily. 12/29/23   [provider]  promethazine -dextromethorphan (PROMETHAZINE -DM) 6.25-15 MG/5ML syrup Take 5 mLs by mouth 4 (four) times daily as needed.  03/05/24   Andra Corean BROCKS, PA-C  sildenafil (VIAGRA) 100 MG tablet Take 100 mg by mouth daily as needed. 03/12/23   [provider]    Scheduled Meds:  Continuous Infusions:  PRN Meds:   Allergies:   Allergies[1]  Social History:   Social History   Socioeconomic History   Marital status: Married    Spouse name: Not on file   Number of children: Not on file   Years of education: Not on file   Highest education level: Not on file  Occupational History   Not on file  Tobacco Use   Smoking status: Never   Smokeless tobacco: Never  Vaping Use   Vaping status: Never Used  Substance and Sexual Activity   Alcohol use: Yes    Comment: occasionally   Drug use: No   Sexual activity: Never  Other Topics Concern   Not on file  Social History Narrative   Not on file   Social Drivers of Health   Tobacco Use: Low Risk (03/05/2024)   Patient History    Smoking Tobacco Use: Never    Smokeless Tobacco Use: Never    Passive Exposure: Not on file  Financial Resource Strain: Not on file  Food Insecurity: Not on file  Transportation Needs: Not on file  Physical Activity: Not on file  Stress: Not on file  Social Connections: Not on file  Intimate Partner Violence: Not on file  Depression (EYV7-0): Not on file  Alcohol Screen: Not on file  Housing: Not on file  Utilities: Not on file  Health Literacy: Not on file    Family History:   No family history on file.   ROS:  Please see the history of present illness.   All other ROS reviewed and negative.     Physical Exam/Data: Vitals:   04/05/24 0707 04/05/24 0715 04/05/24 0930 04/05/24 1114  BP:  (!) 148/71 (!) 155/78   Pulse:  70 64   Resp:  17 18   Temp:  97.6 F (36.4 C)  (!) 97.4 F (36.3 C)  TempSrc:  Oral    SpO2:  99% 99%   Weight: 83.5 kg     Height: 5' 10 (1.778 m)      No intake or output data in the 24 hours ending 04/05/24 1219    04/05/2024    7:07 AM 04/02/2023    3:28 PM 03/04/2023     8:24 AM  Last 3 Weights  Weight (lbs) 184 lb 194 lb 194 lb 8 oz  Weight (kg) 83.462 kg 87.998 kg 88.225 kg     Body mass index is 26.4 kg/m.  General:  Well nourished, well developed, in no acute distress. Alert and orientated on room air. HEENT: normal Neck: no JVD Vascular: No carotid bruits; Distal pulses 2+ bilaterally Cardiac:  normal S1, S2; RRR; no murmur Lungs:  clear to auscultation bilaterally, no wheezing, rhonchi or rales  Abd: soft, nontender, no hepatomegaly  Ext: no edema Musculoskeletal:  No deformities Skin: warm and dry  Neuro:  no focal abnormalities noted Psych:  Normal affect   EKG:  The EKG was personally reviewed and demonstrates:  normal sinus rhythm with a rate of 64, abnormal R wave progression, and minimal ST elevation in V2 and V3 not meeting criteria for STEMI. Telemetry:  Telemetry was personally reviewed and demonstrates: Normal sinus rhythm with heart rates in the 60s.  Relevant CV Studies: Echo pending  Laboratory Data: High Sensitivity Troponin:  No results for input(s): TROPONINIHS in the last 720 hours.  Recent Labs  Lab 04/05/24 0714 04/05/24 0934  TRNPT <15 <15      Chemistry Recent Labs  Lab 04/05/24 0714  NA 140  K 4.3  CL 104  CO2 27  GLUCOSE 105*  BUN 13  CREATININE 1.08  CALCIUM 9.5  GFRNONAA >60  ANIONGAP 8    No results for input(s): PROT, ALBUMIN, AST, ALT, ALKPHOS, BILITOT in the last 168 hours. Lipids No results for input(s): CHOL, TRIG, HDL, LABVLDL, LDLCALC, CHOLHDL in the last 168 hours.  Hematology Recent Labs  Lab 04/05/24 0714  WBC 6.6  RBC 4.59  HGB 15.1  HCT 42.5  MCV 92.6  MCH 32.9  MCHC 35.5  RDW 13.1  PLT 166   Thyroid No results for input(s): TSH, FREET4 in the last 168 hours.  BNPNo results for input(s): BNP, PROBNP in the last 168 hours.  DDimer No results for input(s): DDIMER in the last 168 hours.  Radiology/Studies:  CT Angio Chest/Abd/Pel for  Dissection W and/or Wo Contrast Result Date: 04/05/2024 CLINICAL DATA:  Chest pain and shortness of breath EXAM: CT ANGIOGRAPHY CHEST, ABDOMEN AND PELVIS TECHNIQUE: Non-contrast CT of the chest was initially obtained. Multidetector CT imaging through the chest, abdomen and pelvis was performed using the standard protocol during bolus administration of intravenous contrast. Multiplanar reconstructed images and MIPs were obtained and reviewed to evaluate the vascular anatomy. RADIATION DOSE REDUCTION: This exam was performed according to the departmental dose-optimization program which includes automated exposure control, adjustment of the mA and/or kV according to patient size and/or use of iterative reconstruction technique. CONTRAST:  OMNIPAQUE  IOHEXOL  350 MG/ML SOLN COMPARISON:  March 11, 2023 FINDINGS: CTA CHEST FINDINGS Cardiovascular: Preferential opacification of the thoracic aorta. No evidence of thoracic aortic aneurysm or dissection. Normal heart size. No pericardial effusion. Mediastinum/Nodes: No enlarged mediastinal, hilar, or axillary lymph nodes. Thyroid gland, trachea, and esophagus demonstrate no significant findings. Lungs/Pleura: Lungs are clear. No pleural effusion or pneumothorax. Musculoskeletal: No chest wall abnormality. No acute or significant osseous findings. Review of the MIP images confirms the above findings. CTA ABDOMEN AND PELVIS FINDINGS VASCULAR Aorta: Normal caliber aorta without aneurysm, dissection, vasculitis or significant stenosis. Celiac: Patent without evidence of aneurysm, dissection, vasculitis or significant stenosis. SMA: Patent without evidence of aneurysm, dissection, vasculitis or significant stenosis. Renals: Both renal arteries are patent without evidence of aneurysm, dissection, vasculitis, fibromuscular dysplasia or significant stenosis. IMA: Patent without evidence of aneurysm, dissection, vasculitis or significant stenosis. Inflow: Patent without  evidence of aneurysm, dissection, vasculitis or significant stenosis. Veins: No obvious venous abnormality within the limitations of this arterial phase study. Review of the MIP images confirms the above findings. NON-VASCULAR Hepatobiliary: Status post cholecystectomy. Left hepatic pneumobilia is noted. No biliary dilatation. Liver is unremarkable. Pancreas: Unremarkable. No pancreatic ductal dilatation or surrounding inflammatory changes. Spleen: Normal in size without focal abnormality. Adrenals/Urinary Tract: Adrenal glands are unremarkable. Small nonobstructive left renal calculus. No hydronephrosis or renal obstruction. Bladder is unremarkable. Stomach/Bowel: Stomach is within normal  limits. Appendix appears normal. No evidence of bowel wall thickening, distention, or inflammatory changes. Lymphatic: No adenopathy is noted. Reproductive: Prostate is unremarkable. Other: No ascites or hernia. Musculoskeletal: No acute or significant osseous findings. Review of the MIP images confirms the above findings. IMPRESSION: 1. No evidence of thoracic or abdominal aortic dissection or aneurysm. 2. Small nonobstructive left renal calculus. 3. Status post cholecystectomy. Left hepatic pneumobilia is noted. Electronically Signed   By: Lynwood Landy Raddle M.D.   On: 04/05/2024 10:16   DG Chest 2 View Result Date: 04/05/2024 EXAM: 2 VIEW(S) XRAY OF THE CHEST 04/05/2024 07:26:15 AM COMPARISON: 03/23/2024 CLINICAL HISTORY: The patient reports shortness of breath. FINDINGS: LUNGS AND PLEURA: No focal pulmonary opacity. No pleural effusion. No pneumothorax. HEART AND MEDIASTINUM: No acute abnormality of the cardiac and mediastinal silhouettes. BONES AND SOFT TISSUES: No acute osseous abnormality. Degenerative changes noted in the thoracic spine. IMPRESSION: 1. No acute process. Electronically signed by: Waddell Calk MD MD 04/05/2024 07:28 AM EST RP Workstation: [id number]     Assessment and Plan: Darren Ponce is a 74 y.o.  male with a hx of hypertension, glaucoma, carotid artery stenosis, and choledocholithiasis who is being seen 04/05/2024 for the evaluation of chest pain at the request of Sid Boning MD.   Chest pain Dyspnea on exertion Patient had chest pain and flulike symptoms that started 6 weeks ago.  For about the past month the patient has had shortness of breath, dyspnea on exertion, and chest pain. Chest x-ray showed no acute process EKG showed normal sinus rhythm with a rate of 64, abnormal R wave progression, and minimal ST elevation in V2 and V3 not meeting criteria for STEMI. Labs showed negative high-sensitivity troponin x 2, and creatinine of 1.08. Recommend hospitalist service admit the patient to Observation. Order ESR and CRP. Order Echo. Plan to do a cardiac CT tomorrow after reassessing creatinine.   Hypertension Prior to admission was on lisinopril  5 mg daily.   Hyperlipidemia Prior to admission was on pravastatin  10 mg daily   Hepatic pneumobilia CT showed left hepatic pneumobilia. Suspect is likely an incidental finding. Ordered LFT's   Risk Assessment/Risk Scores:  TIMI Risk Score for Unstable Angina or Non-ST Elevation MI:   The patient's TIMI risk score is 2, which indicates a 8% risk of all cause mortality, new or recurrent myocardial infarction or need for urgent revascularization in the next 14 days.         For questions or updates, please contact [facility] Please consult [email or web address] for contact info under     Signed, Morse Clause, PA-C  04/05/2024 12:19 PM     [1]  Allergies Allergen Reactions   Codeine     vomiting   "

## 2024-04-05 NOTE — ED Notes (Signed)
 CCMD called; Pt. Is on monitoring

## 2024-04-05 NOTE — ED Notes (Signed)
 Report given to room 37 RN

## 2024-04-05 NOTE — ED Triage Notes (Addendum)
 Pt. Stated, Ive had SOB, and chest pain for 2 weeks. This morning around 0400 both my arms were hurting and my right arm lower felt numb but no tingling. Both arm pain has gone. I do still have some numbness in my lower right arm at my hand.  I was seen by my primary and referred to cardiologist and they never called.

## 2024-04-05 NOTE — ED Provider Notes (Signed)
 " [place] [facility] Provider Note   CSN: [id number] Arrival date & time: 04/05/24  9355     History  Chief Complaint  Patient presents with   Chest Pain   Shortness of Breath   Arm Pain    Darren Ponce is a 74 y.o. male with PMH as listed below who presents with SOB, and chest pain for 2 weeks. Had flu-like sxs a 6 weeks ago that improved and he's been feeling better for a few weeks. He's had chest pain in the central region as well as SOB. Earlier this week the CP/SOB/DOE got significantly worse during sexual intercourse/with exertion. He had some CP and R arm numbness when he woke up this morning. He thought he slept on it wrong but he got up and moved around and it wouldn't go away. He doesn't currently have any symptoms. Hasn't taken any medicine for it. No falls. No h/o similar pain. No h/o cardiac disease. No tobacco use.  No leg swelling, recent travel, hospitalizations or surgeries. Reports that he was seen by my primary and referred to cardiologist and they never called.    Past Medical History:  Diagnosis Date   Choledocholithiasis 09/02/2012   Glaucoma    Hypertension    Kidney stone    PONV (postoperative nausea and vomiting)        Home Medications Prior to Admission medications  Medication Sig Start Date End Date Taking? Authorizing Provider  acetaminophen  (TYLENOL ) 500 MG tablet Take 1,000 mg by mouth every 6 (six) hours as needed for pain.    [provider]  albuterol  (VENTOLIN  HFA) 108 (90 Base) MCG/ACT inhaler Inhale 2 puffs into the lungs every 4 (four) hours as needed. 02/22/24   [provider]  benzonatate  (TESSALON ) 100 MG capsule Take 1 capsule (100 mg total) by mouth every 8 (eight) hours. 03/05/24   Andra Corean BROCKS, PA-C  latanoprost  (XALATAN ) 0.005 % ophthalmic solution Place 1 drop into the left eye daily before breakfast. 04/29/19   [provider]  lisinopril  (ZESTRIL ) 5 MG tablet  Take 1 tablet (5 mg total) by mouth at bedtime. 05/13/19   Petrucelli, Samantha R, PA-C  pravastatin  (PRAVACHOL ) 10 MG tablet Take 10 mg by mouth daily. 12/29/23   [provider]  promethazine -dextromethorphan (PROMETHAZINE -DM) 6.25-15 MG/5ML syrup Take 5 mLs by mouth 4 (four) times daily as needed. 03/05/24   Andra Corean BROCKS, PA-C  sildenafil (VIAGRA) 100 MG tablet Take 100 mg by mouth daily as needed. 03/12/23   [provider]      Allergies    Codeine    Review of Systems   Review of Systems A 10 point review of systems was performed and is negative unless otherwise reported in HPI.  Physical Exam Updated Vital Signs BP (!) 148/71 (BP Location: Left Arm)   Pulse 70   Temp 97.6 F (36.4 C) (Oral)   Resp 17   Ht 5' 10 (1.778 m)   Wt 83.5 kg   SpO2 99%   BMI 26.40 kg/m  Physical Exam General: Normal appearing male, lying in bed.  HEENT: EOMI, PERRLA, Sclera anicteric, MMM, trachea midline. No bruit on carotid auscultation. Cardiology: RRR, no murmurs/rubs/gallops. BL radial and DP pulses equal bilaterally. No chest wall TTP.  Resp: Normal respiratory rate and effort. CTAB, no wheezes, rhonchi, crackles.  Abd: Soft, non-tender, non-distended. No rebound tenderness or guarding.  GU: Deferred. MSK: No peripheral edema or signs of trauma. Extremities  without deformity or TTP. No cyanosis or clubbing. Skin: warm, dry.  Back: No CVA tenderness Neuro: A&Ox4, CNs II-XII grossly intact. 5/5 strength in all extremities. Sensation grossly intact. Normal speech.  Psych: Normal mood and affect.   ED Results / Procedures / Treatments   Labs (all labs ordered are listed, but only abnormal results are displayed) Labs Reviewed  BASIC METABOLIC PANEL WITH GFR - Abnormal; Notable for the following components:      Result Value   Glucose, Bld 105 (*)    All other components within normal limits  CBC  TROPONIN T, HIGH SENSITIVITY  TROPONIN T, HIGH SENSITIVITY     EKG EKG Interpretation Date/Time:  Tuesday April 05 2024 08:20:09 EST Ventricular Rate:  64 PR Interval:  172 QRS Duration:  94 QT Interval:  394 QTC Calculation: 407 R Axis:   7  Text Interpretation: Sinus rhythm Abnormal R-wave progression, early transition Confirmed by Franklyn Gills [id number]) on 04/05/2024 8:25:25 AM  Radiology DG Chest 2 View Result Date: 04/05/2024 EXAM: 2 VIEW(S) XRAY OF THE CHEST 04/05/2024 07:26:15 AM COMPARISON: 03/23/2024 CLINICAL HISTORY: The patient reports shortness of breath. FINDINGS: LUNGS AND PLEURA: No focal pulmonary opacity. No pleural effusion. No pneumothorax. HEART AND MEDIASTINUM: No acute abnormality of the cardiac and mediastinal silhouettes. BONES AND SOFT TISSUES: No acute osseous abnormality. Degenerative changes noted in the thoracic spine. IMPRESSION: 1. No acute process. Electronically signed by: Waddell Calk MD MD 04/05/2024 07:28 AM EST RP Workstation: [id number]    Procedures Procedures    Medications Ordered in ED Medications  perflutren  lipid microspheres (DEFINITY ) IV suspension (3 mLs Intravenous Given 04/05/24 1541)  iohexol  (OMNIPAQUE ) 350 MG/ML injection 100 mL (100 mLs Intravenous Contrast Given 04/05/24 0954)    ED Course/ Medical Decision Making/ A&P                          Medical Decision Making Amount and/or Complexity of Data Reviewed Labs: ordered. Decision-making details documented in ED Course. Radiology: ordered. Decision-making details documented in ED Course.  Risk Prescription drug management. Decision regarding hospitalization.    This patient presents to the ED for concern of CP/SOB/DOE, R arm numbness, this involves an extensive number of treatment options, and is a complaint that carries with it a high risk of complications and morbidity.  I considered the following differential and admission for this acute, potentially life threatening condition.   MDM:    DDX for chest pain includes but is  not limited to: Consider AAS given CP, neck pain, R arm intermittent numbness. Will obtain CT dissection study. Consider ACS/unstable angina/angina. EKG w/o signs of arrhythmia/ischemia, trop neg. Will get cardiology consult. No signs of pericarditis.  No abdominal pain and no c/f biliary disease. Lower c/f PE without risk factors or signs of DVT. No TTP to indicate MSK pain. R arm numbness was no tingling, went away, must consider TIA as possible cause as well.    Clinical Course as of 04/09/24 1431  Tue Apr 05, 2024  9187 Troponin T High Sensitivity: <15 First troponin reassuringly negative [HN]  0812 DG Chest 2 View 1. No acute process. [HN]  0812 CBC wnl [HN]  0812 Basic metabolic panel(!) wnl [HN]  1021 CT Angio Chest/Abd/Pel for Dissection W and/or Wo Contrast 1. No evidence of thoracic or abdominal aortic dissection or aneurysm. 2. Small nonobstructive left renal calculus. 3. Status post cholecystectomy. Left hepatic pneumobilia is noted.   [HN]  1021 Troponin  T High Sensitivity: <15 2nd trop neg [HN]  1022 HEART score is 5 for HTN, age, highly suspicious history. Consulted to cardiology. [HN]    Clinical Course User Index [HN] Franklyn Sid SAILOR, MD    Labs: I Ordered, and personally interpreted labs.  The pertinent results include:  those listed above  Imaging Studies ordered: I ordered imaging studies including CTA chest I independently visualized and interpreted imaging. I agree with the radiologist interpretation  Additional history obtained from chart review.  Cardiac Monitoring: The patient was maintained on a cardiac monitor.  I personally viewed and interpreted the cardiac monitored which showed an underlying rhythm of: NSR  Reevaluation: After the interventions noted above, I reevaluated the patient and found that they have :stayed the same  Social Determinants of Health: Lives independently  Disposition:  Admit to hospitalist w/ cardiology  following  Co morbidities that complicate the patient evaluation  Past Medical History:  Diagnosis Date   Choledocholithiasis 09/02/2012   Glaucoma    Hypertension    Kidney stone    PONV (postoperative nausea and vomiting)      Medicines No orders of the defined types were placed in this encounter.   I have reviewed the patients home medicines and have made adjustments as needed  Problem List / ED Course: Problem List Items Addressed This Visit       Other   * (Principal) Chest pain - Primary                This note was created using dictation software, which may contain spelling or grammatical errors.    Franklyn Sid SAILOR, MD 04/09/24 1432  "

## 2024-04-05 NOTE — H&P (Signed)
 " History and Physical    Patient: Darren Ponce FMW:[id number] DOB: Jun 09, 1950 DOA: 04/05/2024 DOS: the patient was seen and examined on 04/05/2024 PCP: Sun, Vyvyan, MD  Patient coming from: Home  Chief Complaint:  Chief Complaint  Patient presents with   Chest Pain   Shortness of Breath   Arm Pain   HPI: Darren Ponce is a 74 y.o. male with medical history significant of hypertension, hyperlipidemia presents to ED with chest pain for 2 weeks, occurring both at rest and on activity not related to food intake,  associated with sob . Patient reports having flu/ viral infection in November and has been having cough with sOB intermittently since then. He denies any  nausea, vomiting, abdominal pain, dizziness, or syncope. He denies having similar complaints in the past. He denies any dysuria, hematemesis, weakness or tingling in his arms.  He currently denies any headaches or dizziness.    Work up in ED He was to be afebrile,  normotensive , HR is 70/min.  Lab work is remarkable for albumin of 3.2 Troponins are negative.  EKG shows sinus rhythm.   CT angi of the chest and abdomen is unremarkable.    Review of Systems: As mentioned in the history of present illness. All other systems reviewed and are negative. Past Medical History:  Diagnosis Date   Choledocholithiasis 09/02/2012   Glaucoma    Hypertension    Kidney stone    PONV (postoperative nausea and vomiting)    Past Surgical History:  Procedure Laterality Date   CARPAL TUNNEL RELEASE Right 06/24/2021   Procedure: RIGHT CARPAL TUNNEL RELEASE;  Surgeon: Murrell Drivers, MD;  Location: [facility];  Service: Orthopedics;  Laterality: Right;   CHOLECYSTECTOMY N/A 09/04/2012   Procedure: LAPAROSCOPIC CHOLECYSTECTOMY WITH INTRAOPERATIVE CHOLANGIOGRAM;  Surgeon: Alm VEAR Angle, MD;  Location: [facility];  Service: General;  Laterality: N/A;   ERCP N/A 09/03/2012   Procedure: ENDOSCOPIC RETROGRADE CHOLANGIOPANCREATOGRAPHY  (ERCP);  Surgeon: Lupita FORBES Commander, MD;  Location: [facility];  Service: Endoscopy;  Laterality: N/A;   HERNIA REPAIR     Social History:  reports that he has never smoked. He has never used smokeless tobacco. He reports current alcohol use. He reports that he does not use drugs.  Allergies[1]  No family history on file.  Prior to Admission medications  Medication Sig Start Date End Date Taking? Authorizing Provider  acetaminophen  (TYLENOL ) 500 MG tablet Take 1,000 mg by mouth every 6 (six) hours as needed for pain.   Yes [provider]  latanoprost  (XALATAN ) 0.005 % ophthalmic solution Place 1 drop into the left eye daily before breakfast. 04/29/19  Yes [provider]  lisinopril  (ZESTRIL ) 5 MG tablet Take 1 tablet (5 mg total) by mouth at bedtime. 05/13/19  Yes Petrucelli, Samantha R, PA-C  pravastatin  (PRAVACHOL ) 10 MG tablet Take 10 mg by mouth daily. 12/29/23  Yes [provider]  Probiotic Product (PROBIOTIC PO) Take 1 tablet by mouth daily.   Yes [provider]  albuterol  (VENTOLIN  HFA) 108 (90 Base) MCG/ACT inhaler Inhale 2 puffs into the lungs every 4 (four) hours as needed. Patient not taking: Reported on 04/05/2024 02/22/24   [provider]  amoxicillin (AMOXIL) 875 MG tablet Take 875 mg by mouth 2 (two) times daily. Patient not taking: Reported on 04/05/2024 03/21/24   [provider]  benzonatate  (TESSALON ) 100 MG capsule Take 1 capsule (100 mg total) by mouth every 8 (eight) hours. Patient not taking: Reported  on 04/05/2024 03/05/24   Andra Corean BROCKS, PA-C  promethazine -dextromethorphan (PROMETHAZINE -DM) 6.25-15 MG/5ML syrup Take 5 mLs by mouth 4 (four) times daily as needed. Patient not taking: Reported on 04/05/2024 03/05/24   Andra Corean BROCKS, PA-C  sildenafil (VIAGRA) 100 MG tablet Take 100 mg by mouth daily as needed. Patient not taking: Reported on 04/05/2024 03/12/23   [provider]    Physical  Exam: Vitals:   04/05/24 1200 04/05/24 1315 04/05/24 1415 04/05/24 1454  BP: 123/71 113/77 139/77   Pulse: 61 69 64   Resp: 17 (!) 25 19   Temp:    98 F (36.7 C)  TempSrc:      SpO2: 97% 94% 96%   Weight:      Height:       General exam: Appears calm and comfortable  Respiratory system: Clear to auscultation. Respiratory effort normal. Cardiovascular system: S1 & S2 heard, RRR. No JVD, Gastrointestinal system: Abdomen is nondistended, soft and nontender. No organomegaly or masses felt. Normal bowel sounds heard. Central nervous system: Alert and oriented. No focal neurological deficits. Extremities: Symmetric 5 x 5 power. Skin: No rashes,  Psychiatry: Mood & affect appropriate.   Data Reviewed: Results for orders placed or performed during the hospital encounter of 04/05/24 (from the past 24 hours)  Basic metabolic panel     Status: Abnormal   Collection Time: 04/05/24  7:14 AM  Result Value Ref Range   Sodium 140 135 - 145 mmol/L   Potassium 4.3 3.5 - 5.1 mmol/L   Chloride 104 98 - 111 mmol/L   CO2 27 22 - 32 mmol/L   Glucose, Bld 105 (H) 70 - 99 mg/dL   BUN 13 8 - 23 mg/dL   Creatinine, Ser 8.91 0.61 - 1.24 mg/dL   Calcium 9.5 8.9 - 89.6 mg/dL   GFR, Estimated >39 >39 mL/min   Anion gap 8 5 - 15  CBC     Status: None   Collection Time: 04/05/24  7:14 AM  Result Value Ref Range   WBC 6.6 4.0 - 10.5 K/uL   RBC 4.59 4.22 - 5.81 MIL/uL   Hemoglobin 15.1 13.0 - 17.0 g/dL   HCT 57.4 60.9 - 47.9 %   MCV 92.6 80.0 - 100.0 fL   MCH 32.9 26.0 - 34.0 pg   MCHC 35.5 30.0 - 36.0 g/dL   RDW 86.8 88.4 - 84.4 %   Platelets 166 150 - 400 K/uL   nRBC 0.0 0.0 - 0.2 %  Troponin T, High Sensitivity     Status: None   Collection Time: 04/05/24  7:14 AM  Result Value Ref Range   Troponin T High Sensitivity <15 0 - 19 ng/L  Troponin T, High Sensitivity     Status: None   Collection Time: 04/05/24  9:34 AM  Result Value Ref Range   Troponin T High Sensitivity <15 0 - 19 ng/L   Sedimentation rate     Status: None   Collection Time: 04/05/24 12:18 PM  Result Value Ref Range   Sed Rate 1 0 - 16 mm/hr  C-reactive protein     Status: None   Collection Time: 04/05/24 12:18 PM  Result Value Ref Range   CRP <0.5 <1.0 mg/dL  Hepatic function panel     Status: Abnormal   Collection Time: 04/05/24 12:18 PM  Result Value Ref Range   Total Protein 4.6 (L) 6.5 - 8.1 g/dL   Albumin 3.2 (L) 3.5 - 5.0 g/dL  AST 14 (L) 15 - 41 U/L   ALT 8 0 - 44 U/L   Alkaline Phosphatase 35 (L) 38 - 126 U/L   Total Bilirubin 0.4 0.0 - 1.2 mg/dL   Bilirubin, Direct 0.2 0.0 - 0.2 mg/dL   Indirect Bilirubin 0.3 0.3 - 0.9 mg/dL     Assessment and Plan:   Atypical chest pain:  - chest pain both at rest and on physical exertion and on deep breathing.  - ACS, PE and dissection were ruled out.  - other differential include, costochondritis, pericarditis, vs GERD.  - pain control with tylenol  and robaxin .  - ECHO done and unremarkable.  - cardiology on board and plans for CT coronaries in am.     Hypertension:  BP parameters are optimal.  Holding lisinopril  as pt has Coronary CT angiogram in am.    Hyperlipidemia Statin on hold until CK levels come back.  Check CK.    Left hepatic pneumobilia: Unclear etiology.  His cholecystectomy /ERCP / instrumentation was in 2014. Differential include incompetent  sphincter of Oddi vs biliary enteric fistula.  Pt asymptomatic.  Recommend MRI liver as outpatient/ follow up with GI.    Advance Care Planning:   Code Status: Full Code   Consults: cardiology  Family Communication: family at bedside.   Severity of Illness: The appropriate patient status for this patient is OBSERVATION. Observation status is judged to be reasonable and necessary in order to provide the required intensity of service to ensure the patient's safety. The patient's presenting symptoms, physical exam findings, and initial radiographic and laboratory data in the  context of their medical condition is felt to place them at decreased risk for further clinical deterioration. Furthermore, it is anticipated that the patient will be medically stable for discharge from the hospital within 2 midnights of admission.   Author: Elgie Butter, MD 04/05/2024 5:59 PM  For on call review [email or web address].     [1]  Allergies Allergen Reactions   Amoxicillin-Pot Clavulanate Diarrhea and Nausea And Vomiting   Codeine Nausea And Vomiting   "

## 2024-04-05 NOTE — ED Notes (Signed)
 Patient transported to CT

## 2024-04-06 ENCOUNTER — Observation Stay (HOSPITAL_BASED_OUTPATIENT_CLINIC_OR_DEPARTMENT_OTHER)

## 2024-04-06 DIAGNOSIS — R072 Precordial pain: Secondary | ICD-10-CM | POA: Diagnosis not present

## 2024-04-06 DIAGNOSIS — R0609 Other forms of dyspnea: Secondary | ICD-10-CM | POA: Diagnosis not present

## 2024-04-06 LAB — CBC WITH DIFFERENTIAL/PLATELET
Abs Immature Granulocytes: 0.06 K/uL (ref 0.00–0.07)
Basophils Absolute: 0.1 K/uL (ref 0.0–0.1)
Basophils Relative: 1 %
Eosinophils Absolute: 0.2 K/uL (ref 0.0–0.5)
Eosinophils Relative: 4 %
HCT: 41.1 % (ref 39.0–52.0)
Hemoglobin: 14.6 g/dL (ref 13.0–17.0)
Immature Granulocytes: 1 %
Lymphocytes Relative: 26 %
Lymphs Abs: 1.7 K/uL (ref 0.7–4.0)
MCH: 33 pg (ref 26.0–34.0)
MCHC: 35.5 g/dL (ref 30.0–36.0)
MCV: 93 fL (ref 80.0–100.0)
Monocytes Absolute: 0.6 K/uL (ref 0.1–1.0)
Monocytes Relative: 8 %
Neutro Abs: 4 K/uL (ref 1.7–7.7)
Neutrophils Relative %: 60 %
Platelets: 145 K/uL — ABNORMAL LOW (ref 150–400)
RBC: 4.42 MIL/uL (ref 4.22–5.81)
RDW: 13 % (ref 11.5–15.5)
WBC: 6.6 K/uL (ref 4.0–10.5)
nRBC: 0 % (ref 0.0–0.2)

## 2024-04-06 LAB — BASIC METABOLIC PANEL WITH GFR
Anion gap: 10 (ref 5–15)
BUN: 11 mg/dL (ref 8–23)
CO2: 26 mmol/L (ref 22–32)
Calcium: 9.3 mg/dL (ref 8.9–10.3)
Chloride: 105 mmol/L (ref 98–111)
Creatinine, Ser: 1.03 mg/dL (ref 0.61–1.24)
GFR, Estimated: >60 mL/min
Glucose, Bld: 91 mg/dL (ref 70–99)
Potassium: 4.3 mmol/L (ref 3.5–5.1)
Sodium: 141 mmol/L (ref 135–145)

## 2024-04-06 LAB — CK: Total CK: 41 U/L — ABNORMAL LOW (ref 49–397)

## 2024-04-06 MED ORDER — METOPROLOL TARTRATE 25 MG PO TABS
25.0000 mg | ORAL_TABLET | Freq: Once | ORAL | Status: AC
  Administered 2024-04-06: 25 mg via ORAL
  Filled 2024-04-06: qty 1

## 2024-04-06 MED ORDER — NITROGLYCERIN 0.4 MG SL SUBL
0.8000 mg | SUBLINGUAL_TABLET | Freq: Once | SUBLINGUAL | Status: AC
  Administered 2024-04-06: 0.8 mg via SUBLINGUAL

## 2024-04-06 MED ORDER — FAMOTIDINE 20 MG PO TABS: 20.0000 mg | ORAL_TABLET | Freq: Two times a day (BID) | ORAL | 0 refills | Status: AC

## 2024-04-06 MED ORDER — ENOXAPARIN SODIUM 40 MG/0.4ML IJ SOSY
40.0000 mg | PREFILLED_SYRINGE | Freq: Every day | INTRAMUSCULAR | Status: DC
  Administered 2024-04-06: 40 mg via SUBCUTANEOUS
  Filled 2024-04-06: qty 0.4

## 2024-04-06 MED ORDER — NITROGLYCERIN 0.4 MG SL SUBL
SUBLINGUAL_TABLET | SUBLINGUAL | Status: AC
  Filled 2024-04-06: qty 2

## 2024-04-06 MED ORDER — LATANOPROST 0.005 % OP SOLN: 1.0000 [drp] | Freq: Every day | OPHTHALMIC | Status: DC

## 2024-04-06 MED ORDER — IOHEXOL 350 MG/ML SOLN
95.0000 mL | Freq: Once | INTRAVENOUS | Status: AC | PRN
  Administered 2024-04-06: 95 mL via INTRAVENOUS

## 2024-04-06 MED ORDER — METOPROLOL TARTRATE 5 MG/5ML IV SOLN
INTRAVENOUS | Status: AC
  Filled 2024-04-06: qty 5

## 2024-04-06 MED ORDER — PRAVASTATIN SODIUM 10 MG PO TABS
10.0000 mg | ORAL_TABLET | Freq: Every day | ORAL | Status: DC
  Administered 2024-04-06: 10 mg via ORAL
  Filled 2024-04-06: qty 1

## 2024-04-06 NOTE — Discharge Summary (Addendum)
 Physician Discharge Summary  Darren Ponce FMW:[id number] DOB: 01-13-1951 DOA: 04/05/2024  PCP: Sun, Vyvyan, MD  Admit date: 04/05/2024 Discharge date: 04/06/2024 Recommendations for Outpatient Follow-up:  Follow up with PCP in 1 weeks-call for appointment Please obtain BMP/CBC in one week  Discharge Dispo: Home Discharge Condition: Stable Code Status:   Code Status: Full Code Diet recommendation:  Diet Order             Diet regular Room service appropriate? Yes; Fluid consistency: Thin  Diet effective now                    Brief/Interim Summary: Darren Ponce is a 74 y.o. male with PMH of f hypertension, hyperlipidemia presents to ED with chest pain for 2 weeks, occurring both at rest and on activity not related to food intake,  associated with sob . Patient reports having flu/ viral infection in November and has been having cough with sOB intermittently since then. He denies any  nausea, vomiting, abdominal pain, dizziness, or syncope. He denies having similar complaints in the past. He denies any dysuria, hematemesis, weakness or tingling in his arms.  He currently denies any headaches or dizziness.  In ZI:jqzampoz,  normotensive , HR is 70/min.  Lab work is remarkable for albumin of 3.2 Troponins are negative.  EKG shows sinus rhythm.  CT angi of the chest and abdomen is unremarkable.  Cardiology was consulted plan for CT coronaries normal and ok for dc home.  Subjective: Seen and examined today C/p some shortness of breath on RA Overnight remains afebrile vital stable on room air labs with stable BMP CK 41 CRP less than 0.5 troponin negative x 2 A1C 5.7  Discharge Diagnoses:  Atypical chest pain:  chest pain both at rest and on physical exertion and on deep breathing.  CTA chest abd pelvis unremarkable. other differential include, costochondritis, pericarditis, vs GERD.  ECHO done and unremarkable.  cardiology on board and awaiting CT coronaries and normal. He used to  be on Prilosec before and stopped due to kidney isues- added pepcid  - advised Fu /w PCP. Spoke to Dr Elmira- he spoke to Dr Shlomo who is  finalizing the read- had Mild distal RCA nonobstructive CAD, no obstructive CAD anywhere. No evidence of central or proximal PE  He is advised to follow up with PCP and PULM as OUTPATIENT AND Cardio okay with discharge home.   Hypertension:  Controlled holding lisinopril  for CT.    Hyperlipidemia: CKD normal continue statin.    Left hepatic pneumobilia: Unclear etiology.  His cholecystectomy /ERCP / instrumentation was in 2014: Differential include incompetent  sphincter of Oddi vs biliary enteric fistula.  Pt asymptomatic.  Recommend MRI liver as outpatient/ follow up with GI.  DVT prophylaxis: enoxaparin  (LOVENOX ) injection 40 mg Start: 04/06/24 1000 Code Status:   Code Status: Full Code Family Communication: plan of care discussed with patient at bedside. Patient status is: Remains hospitalized because of severity of illness Level of care: Telemetry   Dispo: The patient is from: Home            Anticipated disposition:Home.   Objective: Vitals last 24 hrs: Vitals:   04/06/24 1127 04/06/24 1250 04/06/24 1300 04/06/24 1324  BP: 129/88 135/76  120/86  Pulse: 68 80 68   Resp: 19 (!) 22 17   Temp: 97.6 F (36.4 C)     TempSrc: Oral     SpO2: 96% 96% 90%   Weight:  Height:        Physical Examination: General exam: alert awake, oriented, older than stated age HEENT:Oral mucosa moist, Ear/Nose WNL grossly Respiratory system: Bilaterally clear BS,no use of accessory muscle Cardiovascular system: S1 & S2 +, No JVD. Gastrointestinal system: Abdomen soft,NT,ND, BS+ Nervous System: Alert, awake, moving all extremities,and following commands. Extremities: extremities warm, leg edema neg Skin: Warm, no rashes MSK: Normal muscle bulk,tone, power     Consultation: See note.  Discharge Instructions  Discharge Instructions      Discharge instructions   Complete by: As directed    Please call call MD or return to ER for similar or worsening recurring problem that brought you to hospital or if any fever,nausea/vomiting,abdominal pain, uncontrolled pain, chest pain,  shortness of breath or any other alarming symptoms.  Please follow-up your doctor as instructed in a week time and call the office for appointment.  Please avoid alcohol, smoking, or any other illicit substance and maintain healthy habits including taking your regular medications as prescribed.  You were cared for by a hospitalist during your hospital stay. If you have any questions about your discharge medications or the care you received while you were in the hospital after you are discharged, you can call the unit and ask to speak with the hospitalist on call if the hospitalist that took care of you is not available.  Once you are discharged, your primary care physician will handle any further medical issues. Please note that NO REFILLS for any discharge medications will be authorized once you are discharged, as it is imperative that you return to your primary care physician (or establish a relationship with a primary care physician if you do not have one) for your aftercare needs so that they can reassess your need for medications and monitor your lab values   Increase activity slowly   Complete by: As directed       Allergies as of 04/06/2024       Reactions   Amoxicillin-pot Clavulanate Diarrhea, Nausea And Vomiting   Codeine Nausea And Vomiting        Medication List     STOP taking these medications    albuterol  108 (90 Base) MCG/ACT inhaler Commonly known as: VENTOLIN  HFA   amoxicillin 875 MG tablet Commonly known as: AMOXIL   benzonatate  100 MG capsule Commonly known as: TESSALON    promethazine -dextromethorphan 6.25-15 MG/5ML syrup Commonly known as: PROMETHAZINE -DM   sildenafil 100 MG tablet Commonly known as: VIAGRA        TAKE these medications    acetaminophen  500 MG tablet Commonly known as: TYLENOL  Take 1,000 mg by mouth every 6 (six) hours as needed for pain.   famotidine  20 MG tablet Commonly known as: PEPCID  Take 1 tablet (20 mg total) by mouth 2 (two) times daily.   latanoprost  0.005 % ophthalmic solution Commonly known as: XALATAN  Place 1 drop into the left eye daily before breakfast.   lisinopril  5 MG tablet Commonly known as: ZESTRIL  Take 1 tablet (5 mg total) by mouth at bedtime.   pravastatin  10 MG tablet Commonly known as: PRAVACHOL  Take 10 mg by mouth daily.   PROBIOTIC PO Take 1 tablet by mouth daily.        Follow-up Information     Sun, Vyvyan, MD Follow up in 1 week(s).   Specialty: Family Medicine Contact information: [phone] [place] [address] [phone]  Allergies[1]  The results of significant diagnostics from this hospitalization (including imaging, microbiology, ancillary and laboratory) are listed below for reference.    Microbiology: No results found for this or any previous visit (from the past 240 hours).  Procedures/Studies: CT CORONARY MORPH W/CTA COR W/SCORE W/CA W/CM &/OR WO/CM Result Date: 04/06/2024 CLINICAL DATA:  Chest pain EXAM: Cardiac/Coronary CTA TECHNIQUE: A non-contrast, gated CT scan was obtained with axial slices of 3 mm through the heart for calcium scoring. Calcium scoring was performed using the Agatston method. A 120 kV prospective, gated, contrast cardiac scan was obtained. Gantry rotation speed was 250 msecs and collimation was 0.6 mm. Two sublingual nitroglycerin  tablets (0.8 mg) were given. The 3D data set was reconstructed in 5% intervals of the 35-75% of the R-R cycle. Diastolic phases were analyzed on a dedicated workstation using MPR, MIP, and VRT modes. The patient received 95 cc of contrast. FINDINGS: Image quality: Excellent. Noise artifact is: Limited. Coronary Arteries:  Normal  coronary origin.  Right dominance. Left main: The left main is a large caliber vessel with a normal take off from the left coronary cusp that bifurcates to form a left anterior descending artery and a left circumflex artery. There is no plaque or stenosis. Left anterior descending artery: The LAD is patent without evidence of plaque or stenosis. The LAD gives off 2 patent diagonal branches. Left circumflex artery: The LCX is non-dominant and patent with no evidence of plaque or stenosis. The LCX gives off 2 patent obtuse marginal branches. Right coronary artery: The RCA is dominant with normal take off from the right coronary cusp. Minimal (<25%) calcified plaque prox RCA. Mild (25-49%) calcified plaque distal RCA. The RCA terminates as a PDA and right posterolateral branch without evidence of plaque or stenosis. Right Atrium: Right atrial size is within normal limits. Right Ventricle: The right ventricular cavity is within normal limits. Left Atrium: Left atrial size is normal in size with no left atrial appendage filling defect. Left Ventricle: The ventricular cavity size is within normal limits. Pulmonary arteries: Normal in size. Pulmonary veins: Normal pulmonary venous drainage. Pericardium: Normal thickness without significant effusion or calcium present. Cardiac valves: The aortic valve is trileaflet without significant calcification. The mitral valve is normal without significant calcification. Aorta: Normal caliber without significant disease. Extra-cardiac findings: See attached radiology report for non-cardiac structures. IMPRESSION: 1. Coronary calcium score of 21.1. This was 19th percentile for age-, sex, and race-matched controls. 2. Normal coronary origin with right dominance. 3. Mild atherosclerosis: 25-49% distal RCA. 4. Consider non atherosclerotic causes of chest pain. RECOMMENDATIONS: 1. CAD-RADS 0: No evidence of CAD (0%). Consider non-atherosclerotic causes of chest pain. 2. CAD-RADS 1: Minimal  non-obstructive CAD (0-24%). Consider non-atherosclerotic causes of chest pain. Consider preventive therapy and risk factor modification. 3. CAD-RADS 2: Mild non-obstructive CAD (25-49%). Consider non-atherosclerotic causes of chest pain. Consider preventive therapy and risk factor modification. 4. CAD-RADS 3: Moderate stenosis. Consider symptom-guided anti-ischemic pharmacotherapy as well as risk factor modification per guideline directed care. Additional analysis with CT FFR will be submitted. 5. CAD-RADS 4: Severe stenosis. (70-99% or > 50% left main). Cardiac catheterization or CT FFR is recommended. Consider symptom-guided anti-ischemic pharmacotherapy as well as risk factor modification per guideline directed care. Invasive coronary angiography recommended with revascularization per published guideline statements. 6. CAD-RADS 5: Total coronary occlusion (100%). Consider cardiac catheterization or viability assessment. Consider symptom-guided anti-ischemic pharmacotherapy as well as risk factor modification per guideline directed care. 7. CAD-RADS N: Non-diagnostic study. Obstructive CAD  can't be excluded. Alternative evaluation is recommended. Wilbert Bihari, MD Electronically Signed   By: Wilbert Bihari M.D.   On: 04/06/2024 16:35   ECHOCARDIOGRAM COMPLETE Result Date: 04/05/2024    ECHOCARDIOGRAM REPORT   Patient Name:   Darren Ponce Date of Exam: 04/05/2024 Medical Rec #:  [id number]    Height:       70.0 in Accession #:    [id number]   Weight:       184.0 lb Date of Birth:  [birth year]   BSA:          2.015 m Patient Age:    73 years     BP:           127/77 mmHg Patient Gender: M            HR:           73 bpm. Exam Location:  Inpatient Procedure: 2D Echo, Cardiac Doppler, Color Doppler and Intracardiac            Opacification Agent (Both Spectral and Color Flow Doppler were            utilized during procedure). Indications:    Chest Pain  History:        Patient has no prior history of Echocardiogram  examinations.                 Signs/Symptoms:Dyspnea.  Sonographer:    Philomena Daring Referring Phys: ZANE ADAMS IMPRESSIONS  1. Left ventricular ejection fraction, by estimation, is 55 to 60%. The left ventricle has normal function. The left ventricle has no regional wall motion abnormalities. Left ventricular diastolic parameters were normal.  2. Right ventricular systolic function is normal. The right ventricular size is mildly enlarged. Tricuspid regurgitation signal is inadequate for assessing PA pressure.  3. The mitral valve is grossly normal. Trivial mitral valve regurgitation. No evidence of mitral stenosis.  4. The aortic valve is tricuspid. There is mild calcification of the aortic valve. Aortic valve regurgitation is not visualized. Aortic valve sclerosis is present, with no evidence of aortic valve stenosis. FINDINGS  Left Ventricle: Left ventricular ejection fraction, by estimation, is 55 to 60%. The left ventricle has normal function. The left ventricle has no regional wall motion abnormalities. Definity  contrast agent was given IV to delineate the left ventricular  endocardial borders. The left ventricular internal cavity size was normal in size. There is no left ventricular hypertrophy. Left ventricular diastolic parameters were normal. Right Ventricle: The right ventricular size is mildly enlarged. No increase in right ventricular wall thickness. Right ventricular systolic function is normal. Tricuspid regurgitation signal is inadequate for assessing PA pressure. Left Atrium: Left atrial size was normal in size. Right Atrium: Right atrial size was normal in size. Pericardium: There is no evidence of pericardial effusion. Mitral Valve: The mitral valve is grossly normal. Trivial mitral valve regurgitation. No evidence of mitral valve stenosis. Tricuspid Valve: The tricuspid valve is grossly normal. Tricuspid valve regurgitation is trivial. No evidence of tricuspid stenosis. Aortic Valve: The aortic  valve is tricuspid. There is mild calcification of the aortic valve. Aortic valve regurgitation is not visualized. Aortic valve sclerosis is present, with no evidence of aortic valve stenosis. Pulmonic Valve: The pulmonic valve was grossly normal. Pulmonic valve regurgitation is not visualized. No evidence of pulmonic stenosis. Aorta: The aortic root and ascending aorta are structurally normal, with no evidence of dilitation. Venous: The inferior vena cava was not well visualized. IAS/Shunts: The atrial septum is  grossly normal.  LEFT VENTRICLE PLAX 2D LVIDd:         4.40 cm   Diastology LVIDs:         2.90 cm   LV e' medial:    7.83 cm/s LV PW:         1.00 cm   LV E/e' medial:  8.5 LV IVS:        1.00 cm   LV e' lateral:   10.10 cm/s LVOT diam:     2.30 cm   LV E/e' lateral: 6.6 LV SV:         81 LV SV Index:   40 LVOT Area:     4.15 cm LV IVRT:       99 msec  RIGHT VENTRICLE RV S prime:     11.50 cm/s TAPSE (M-mode): 2.3 cm LEFT ATRIUM             Index        RIGHT ATRIUM           Index LA diam:        3.60 cm 1.79 cm/m   RA Area:     14.70 cm LA Vol (A2C):   45.6 ml 22.63 ml/m  RA Volume:   34.30 ml  17.03 ml/m LA Vol (A4C):   42.3 ml 21.00 ml/m LA Biplane Vol: 44.2 ml 21.94 ml/m  AORTIC VALVE LVOT Vmax:   88.60 cm/s LVOT Vmean:  56.100 cm/s LVOT VTI:    0.195 m  AORTA Ao Root diam: 3.30 cm Ao Asc diam:  3.20 cm MITRAL VALVE MV Area (PHT): 3.56 cm    SHUNTS MV Decel Time: 213 msec    Systemic VTI:  0.20 m MV E velocity: 66.60 cm/s  Systemic Diam: 2.30 cm MV A velocity: 81.80 cm/s MV E/A ratio:  0.81 Darryle Decent MD Electronically signed by Darryle Decent MD Signature Date/Time: 04/05/2024/3:55:17 PM    Final    CT Angio Chest/Abd/Pel for Dissection W and/or Wo Contrast Result Date: 04/05/2024 CLINICAL DATA:  Chest pain and shortness of breath EXAM: CT ANGIOGRAPHY CHEST, ABDOMEN AND PELVIS TECHNIQUE: Non-contrast CT of the chest was initially obtained. Multidetector CT imaging through the chest,  abdomen and pelvis was performed using the standard protocol during bolus administration of intravenous contrast. Multiplanar reconstructed images and MIPs were obtained and reviewed to evaluate the vascular anatomy. RADIATION DOSE REDUCTION: This exam was performed according to the departmental dose-optimization program which includes automated exposure control, adjustment of the mA and/or kV according to patient size and/or use of iterative reconstruction technique. CONTRAST:  OMNIPAQUE  IOHEXOL  350 MG/ML SOLN COMPARISON:  March 11, 2023 FINDINGS: CTA CHEST FINDINGS Cardiovascular: Preferential opacification of the thoracic aorta. No evidence of thoracic aortic aneurysm or dissection. Normal heart size. No pericardial effusion. Mediastinum/Nodes: No enlarged mediastinal, hilar, or axillary lymph nodes. Thyroid gland, trachea, and esophagus demonstrate no significant findings. Lungs/Pleura: Lungs are clear. No pleural effusion or pneumothorax. Musculoskeletal: No chest wall abnormality. No acute or significant osseous findings. Review of the MIP images confirms the above findings. CTA ABDOMEN AND PELVIS FINDINGS VASCULAR Aorta: Normal caliber aorta without aneurysm, dissection, vasculitis or significant stenosis. Celiac: Patent without evidence of aneurysm, dissection, vasculitis or significant stenosis. SMA: Patent without evidence of aneurysm, dissection, vasculitis or significant stenosis. Renals: Both renal arteries are patent without evidence of aneurysm, dissection, vasculitis, fibromuscular dysplasia or significant stenosis. IMA: Patent without evidence of aneurysm, dissection, vasculitis or significant stenosis. Inflow: Patent without evidence  of aneurysm, dissection, vasculitis or significant stenosis. Veins: No obvious venous abnormality within the limitations of this arterial phase study. Review of the MIP images confirms the above findings. NON-VASCULAR Hepatobiliary: Status post  cholecystectomy. Left hepatic pneumobilia is noted. No biliary dilatation. Liver is unremarkable. Pancreas: Unremarkable. No pancreatic ductal dilatation or surrounding inflammatory changes. Spleen: Normal in size without focal abnormality. Adrenals/Urinary Tract: Adrenal glands are unremarkable. Small nonobstructive left renal calculus. No hydronephrosis or renal obstruction. Bladder is unremarkable. Stomach/Bowel: Stomach is within normal limits. Appendix appears normal. No evidence of bowel wall thickening, distention, or inflammatory changes. Lymphatic: No adenopathy is noted. Reproductive: Prostate is unremarkable. Other: No ascites or hernia. Musculoskeletal: No acute or significant osseous findings. Review of the MIP images confirms the above findings. IMPRESSION: 1. No evidence of thoracic or abdominal aortic dissection or aneurysm. 2. Small nonobstructive left renal calculus. 3. Status post cholecystectomy. Left hepatic pneumobilia is noted. Electronically Signed   By: Lynwood Landy Raddle M.D.   On: 04/05/2024 10:16   DG Chest 2 View Result Date: 04/05/2024 EXAM: 2 VIEW(S) XRAY OF THE CHEST 04/05/2024 07:26:15 AM COMPARISON: 03/23/2024 CLINICAL HISTORY: The patient reports shortness of breath. FINDINGS: LUNGS AND PLEURA: No focal pulmonary opacity. No pleural effusion. No pneumothorax. HEART AND MEDIASTINUM: No acute abnormality of the cardiac and mediastinal silhouettes. BONES AND SOFT TISSUES: No acute osseous abnormality. Degenerative changes noted in the thoracic spine. IMPRESSION: 1. No acute process. Electronically signed by: Waddell Calk MD MD 04/05/2024 07:28 AM EST RP Workstation: [id number]    Labs: BNP (last 3 results) No results for input(s): BNP in the last 8760 hours. Basic Metabolic Panel: Recent Labs  Lab 04/05/24 0714 04/06/24 0327  NA 140 141  K 4.3 4.3  CL 104 105  CO2 27 26  GLUCOSE 105* 91  BUN 13 11  CREATININE 1.08 1.03  CALCIUM 9.5 9.3   Liver Function  Tests: Recent Labs  Lab 04/05/24 1218  AST 14*  ALT 8  ALKPHOS 35*  BILITOT 0.4  PROT 4.6*  ALBUMIN 3.2*   No results for input(s): LIPASE, AMYLASE in the last 168 hours. No results for input(s): AMMONIA in the last 168 hours. CBC: Recent Labs  Lab 04/05/24 0714 04/06/24 0327  WBC 6.6 6.6  NEUTROABS  --  4.0  HGB 15.1 14.6  HCT 42.5 41.1  MCV 92.6 93.0  PLT 166 145*   CBG: No results for input(s): GLUCAP in the last 168 hours. Hgb A1c Recent Labs    04/05/24 0714  HGBA1C 5.7*   Anemia work up No results for input(s): VITAMINB12, FOLATE, FERRITIN, TIBC, IRON, RETICCTPCT in the last 72 hours. Cardiac Enzymes: Recent Labs  Lab 04/06/24 0327  CKTOTAL 41*   Urinalysis    Component Value Date/Time   COLORURINE YELLOW 04/02/2023 1636   APPEARANCEUR CLEAR 04/02/2023 1636   LABSPEC 1.026 04/02/2023 1636   PHURINE 5.5 04/02/2023 1636   GLUCOSEU NEGATIVE 04/02/2023 1636   HGBUR SMALL (A) 04/02/2023 1636   BILIRUBINUR NEGATIVE 04/02/2023 1636   KETONESUR NEGATIVE 04/02/2023 1636   PROTEINUR TRACE (A) 04/02/2023 1636   UROBILINOGEN 0.2 09/01/2012 0534   NITRITE NEGATIVE 04/02/2023 1636   LEUKOCYTESUR NEGATIVE 04/02/2023 1636   Sepsis Labs Recent Labs  Lab 04/05/24 0714 04/06/24 0327  WBC 6.6 6.6   Microbiology No results found for this or any previous visit (from the past 240 hours).  Time coordinating discharge: 25 minutes  SIGNED: Mennie LAMY, MD  [facility] 04/06/2024, 4:41 PM  If 7PM-7AM, please contact night-coverage [email or web address]        [1]  Allergies Allergen Reactions   Amoxicillin-Pot Clavulanate Diarrhea and Nausea And Vomiting   Codeine Nausea And Vomiting

## 2024-04-06 NOTE — Progress Notes (Signed)
"  ° °  Patient Name: Darren Ponce Date of Encounter: 04/06/2024 [facility] Cardiologist: Newman [name], MD   Interval Summary  .    No chest pain or shortness of breath at rest.  Diarrhea with Protonix .  Vital Signs .    Vitals:   04/06/24 0400 04/06/24 0600 04/06/24 0609 04/06/24 0700  BP: 129/86 (!) 152/86  (!) 147/85  Pulse: (!) 59 88  81  Resp: 12 14  (!) 23  Temp:   97.7 F (36.5 C) (!) 97.5 F (36.4 C)  TempSrc:   Oral Oral  SpO2: 94% 94%  94%  Weight:      Height:        Intake/Output Summary (Last 24 hours) at 04/06/2024 0922 Last data filed at 04/06/2024 0306 Gross per 24 hour  Intake 400 ml  Output 1000 ml  Net -600 ml      04/05/2024    7:07 AM 04/02/2023    3:28 PM 03/04/2023    8:24 AM  Last 3 Weights  Weight (lbs) 184 lb 194 lb 194 lb 8 oz  Weight (kg) 83.462 kg 87.998 kg 88.225 kg      Telemetry/ECG    Telemetry 04/06/2024- Personally Reviewed No significant arrhythmia  Echocardiogram 04/05/2024:  1. Left ventricular ejection fraction, by estimation, is 55 to 60%. The  left ventricle has normal function. The left ventricle has no regional  wall motion abnormalities. Left ventricular diastolic parameters were  normal.   2. Right ventricular systolic function is normal. The right ventricular  size is mildly enlarged. Tricuspid regurgitation signal is inadequate for  assessing PA pressure.   3. The mitral valve is grossly normal. Trivial mitral valve  regurgitation. No evidence of mitral stenosis.   4. The aortic valve is tricuspid. There is mild calcification of the  aortic valve. Aortic valve regurgitation is not visualized. Aortic valve  sclerosis is present, with no evidence of aortic valve stenosis.  Personally reviewed.  RV enlargement is equivocal.  Physical Exam .   Physical Exam Vitals and nursing note reviewed.  Constitutional:      General: He is not in acute distress. Neck:     Vascular: No JVD.  Cardiovascular:      Rate and Rhythm: Normal rate and regular rhythm.     Heart sounds: Normal heart sounds. No murmur heard. Pulmonary:     Effort: Pulmonary effort is normal.     Breath sounds: Normal breath sounds. No wheezing or rales.  Musculoskeletal:     Right lower leg: No edema.     Left lower leg: No edema.      Assessment & Plan .     74 y.o. male with hypertension, mild carotid artery stenosis, choledocholithiasis, s/p cholecystectomy, now with chest pain and shortness of breath   Chest pain, exertional dyspnea: ACS, PE excluded.  Normal LV function, RV enlargement is only equivocal.  Do not think that explains his symptoms.  Echo with normal systolic function. Coronary CT angiogram today to evaluate for obstructive CAD.   Left hepatic pneumobilia: Incidental finding, unclear of clinical significance.  Defer to primary team.  For questions or updates, please contact [facility] Please consult [email or web address] for contact info under        Signed, Newman [name], MD   "

## 2024-04-06 NOTE — ED Notes (Signed)
" °   04/06/24 1119  Stool Measurement/Characteristics  Bowel Incontinence No  Stool Type Type 7 (Liquid consistency with no solid pieces)  Has the patient had three Type 7 stools in the last 24 hours? Yes (Noninfectious cause of loose stool is known)  Stool Source Rectum    "

## 2024-04-06 NOTE — Progress Notes (Signed)
 Transition of Care [facility]) - Inpatient Brief Assessment   Patient Details  Name: Darren Ponce MRN: [id number] Date of Birth: Jun 25, 1950  Transition of Care [facility]) CM/SW Contact:    Debarah Saunas, RN Phone Number: 04/06/2024, 9:36 AM   Clinical Narrative: Inpatient Care Management (ICM) has reviewed patient at bedside and no ICM needs have been identified at this time. We will continue to monitor patient advancement through interdisciplinary progression rounds. If new patient transition needs arise, please place a ICM consult.    Transition of Care Asessment: Insurance and Status: (P) Insurance coverage has been reviewed Patient has primary care physician: (P) Yes Home environment has been reviewed: (P) from home (ranch) with spouse Prior level of function:: (P) independent Prior/Current Home Services: (P) No current home services Social Drivers of Health Review: (P) SDOH reviewed no interventions necessary Readmission risk has been reviewed: (P) Yes Transition of care needs: (P) no transition of care needs at this time

## 2024-04-06 NOTE — ED Notes (Signed)
 Secure chat sent to Dr. Mennie requesting hm to come talk to pt regarding his discharge. Awaiting response

## 2024-04-06 NOTE — Hospital Course (Addendum)
 Darren Ponce is a 74 y.o. male with PMH of f hypertension, hyperlipidemia presents to ED with chest pain for 2 weeks, occurring both at rest and on activity not related to food intake,  associated with sob . Patient reports having flu/ viral infection in November and has been having cough with sOB intermittently since then. He denies any  nausea, vomiting, abdominal pain, dizziness, or syncope. He denies having similar complaints in the past. He denies any dysuria, hematemesis, weakness or tingling in his arms.  He currently denies any headaches or dizziness.  In ZI:jqzampoz,  normotensive , HR is 70/min.  Lab work is remarkable for albumin of 3.2 Troponins are negative.  EKG shows sinus rhythm.  CT angi of the chest and abdomen is unremarkable.  Cardiology was consulted plan for CT coronaries normal and ok for dc home.  Subjective: Seen and examined today C/p some shortness of breath on RA Overnight remains afebrile vital stable on room air labs with stable BMP CK 41 CRP less than 0.5 troponin negative x 2 A1C 5.7  Discharge Diagnoses:  Atypical chest pain:  chest pain both at rest and on physical exertion and on deep breathing.  CTA chest abd pelvis unremarkable. other differential include, costochondritis, pericarditis, vs GERD.  ECHO done and unremarkable.  cardiology on board and awaiting CT coronaries and normal. He used to be on Prilosec before and stopped due to kidney isues- added pepcid  - advised Fu /w PCP. Spoke to Dr Elmira- he spoke to Dr Shlomo who is  finalizing the read- had Mild distal RCA nonobstructive CAD, no obstructive CAD anywhere. No evidence of central or proximal PE  He is advised to follow up with PCP and PULM as OUTPATIENT AND Cardio okay with discharge home.   Hypertension:  Controlled holding lisinopril  for CT.    Hyperlipidemia: CKD normal continue statin.    Left hepatic pneumobilia: Unclear etiology.  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  Pt asymptomatic.  Recommend MRI liver as outpatient/ follow up with GI.  DVT prophylaxis: enoxaparin  (LOVENOX ) injection 40 mg Start: 04/06/24 1000 Code Status:   Code Status: Full Code Family Communication: plan of care discussed with patient at bedside. Patient status is: Remains hospitalized because of severity of illness Level of care: Telemetry   Dispo: The patient is from: Home            Anticipated disposition:Home.   Objective: Vitals last 24 hrs: Vitals:   04/06/24 1127 04/06/24 1250 04/06/24 1300 04/06/24 1324  BP: 129/88 135/76  120/86  Pulse: 68 80 68   Resp: 19 (!) 22 17   Temp: 97.6 F (36.4 C)     TempSrc: Oral     SpO2: 96% 96% 90%   Weight:      Height:        Physical Examination: General exam: alert awake, oriented, older than stated age HEENT:Oral mucosa moist, Ear/Nose WNL grossly Respiratory system: Bilaterally clear BS,no use of accessory muscle Cardiovascular system: S1 & S2 +, No JVD. Gastrointestinal system: Abdomen soft,NT,ND, BS+ Nervous System: Alert, awake, moving all extremities,and following commands. Extremities: extremities warm, leg edema neg Skin: Warm, no rashes MSK: Normal muscle bulk,tone, power

## 2024-04-06 NOTE — Care Management Obs Status (Signed)
 MEDICARE OBSERVATION STATUS NOTIFICATION   Patient Details  Name: Darren Ponce MRN: [id number] Date of Birth: 29-Sep-1950   Medicare Observation Status Notification Given:  Yes    Debarah Saunas, RN 04/06/2024, 9:35 AM

## 2024-04-08 ENCOUNTER — Emergency Department (HOSPITAL_COMMUNITY)
Admission: EM | Admit: 2024-04-08 | Discharge: 2024-04-09 | Disposition: A | Discharge status: Home / Self Care | Attending: Emergency Medicine | Admitting: Emergency Medicine

## 2024-04-08 ENCOUNTER — Emergency Department (HOSPITAL_COMMUNITY)

## 2024-04-08 ENCOUNTER — Encounter (HOSPITAL_COMMUNITY): Payer: Self-pay | Admitting: *Deleted

## 2024-04-08 ENCOUNTER — Other Ambulatory Visit: Payer: Self-pay

## 2024-04-08 DIAGNOSIS — R079 Chest pain, unspecified: Secondary | ICD-10-CM | POA: Diagnosis not present

## 2024-04-08 DIAGNOSIS — I10 Essential (primary) hypertension: Secondary | ICD-10-CM | POA: Insufficient documentation

## 2024-04-08 DIAGNOSIS — R0602 Shortness of breath: Secondary | ICD-10-CM | POA: Insufficient documentation

## 2024-04-08 DIAGNOSIS — Z79899 Other long term (current) drug therapy: Secondary | ICD-10-CM | POA: Insufficient documentation

## 2024-04-08 LAB — CBC
HCT: 40.2 % (ref 39.0–52.0)
Hemoglobin: 14.3 g/dL (ref 13.0–17.0)
MCH: 33.2 pg (ref 26.0–34.0)
MCHC: 35.6 g/dL (ref 30.0–36.0)
MCV: 93.3 fL (ref 80.0–100.0)
Platelets: 168 K/uL (ref 150–400)
RBC: 4.31 MIL/uL (ref 4.22–5.81)
RDW: 12.9 % (ref 11.5–15.5)
WBC: 6.7 K/uL (ref 4.0–10.5)
nRBC: 0 % (ref 0.0–0.2)

## 2024-04-08 LAB — BASIC METABOLIC PANEL WITH GFR
Anion gap: 10 (ref 5–15)
BUN: 13 mg/dL (ref 8–23)
CO2: 27 mmol/L (ref 22–32)
Calcium: 9.8 mg/dL (ref 8.9–10.3)
Chloride: 104 mmol/L (ref 98–111)
Creatinine, Ser: 1.15 mg/dL (ref 0.61–1.24)
GFR, Estimated: >60 mL/min
Glucose, Bld: 150 mg/dL — ABNORMAL HIGH (ref 70–99)
Potassium: 4.1 mmol/L (ref 3.5–5.1)
Sodium: 141 mmol/L (ref 135–145)

## 2024-04-08 LAB — D-DIMER, QUANTITATIVE: D-Dimer, Quant: <0.27 ug{FEU}/mL (ref 0.00–0.50)

## 2024-04-08 LAB — TROPONIN T, HIGH SENSITIVITY: Troponin T High Sensitivity: <15 ng/L (ref 0–19)

## 2024-04-08 NOTE — ED Triage Notes (Signed)
 Here by POV from home for sob, hard to get a sufficient breath. Verbalizes seen recently in ED for 2d for the same, d/c'd 2 nights ago. Describes sx as not better, and worse this after noon. Denies pain, cough, congestion, fever. URI sx resolved 1 week ago. Mentions additionally mid abd bloated, and intermittent light headedness. Alert, NAD, calm, interactive, resps e/u, speaking in clear complete sentences. Skin W&D. VSS.

## 2024-04-08 NOTE — ED Provider Triage Note (Signed)
 Emergency Medicine Provider Triage Evaluation Note  Darren Ponce , a 74 y.o. male  was evaluated in triage.  Pt complains of shortness of breath.  Patient was recently hospitalized for similar complaint, had full workup including CTA chest abdomen pelvis for dissection study as well as CT coronary imaging, has been referred to cardiology and has an appointment 1/28.  Patient endorses ongoing shortness of breath at rest stating feels like I cannot catch my breath.  He also endorses diarrhea as well as occasional chest/epigastric pain.  His symptoms have been persistent since he was discharged from the hospital and he reports that he does not feel any improvement, he was concerned that he was not given an exquisite reason for why his symptoms have been ongoing.  No history of COPD.  Review of Systems  Positive: As above Negative: As above  Physical Exam  BP (!) 131/95 (BP Location: Left Arm)   Pulse 76   Temp 98.1 F (36.7 C) (Oral)   Resp 17   Ht 5' 7 (1.702 m)   Wt 83.5 kg   SpO2 99%   BMI 28.82 kg/m  Gen:   Awake, no distress   Resp:  Normal effort  MSK:   Moves extremities without difficulty  Other:    Medical Decision Making  Medically screening exam initiated at 6:01 PM.  Appropriate orders placed.  Darren Ponce was informed that the remainder of the evaluation will be completed by another provider, this initial triage assessment does not replace that evaluation, and the importance of remaining in the ED until their evaluation is complete.     Darren Ponce, [place] 04/08/24 [phone]

## 2024-04-08 NOTE — ED Notes (Signed)
 1 unsuccesful blood draw attempt

## 2024-04-09 NOTE — Discharge Instructions (Signed)
 Your workup this evening was reassuring.  Please continue to plan to follow-up with your primary care provider and with pulmonology.  Return to the emergency department if you develop any life-threatening symptoms

## 2024-04-09 NOTE — ED Provider Notes (Signed)
 " [place] EMERGENCY DEPARTMENT AT [facility] Provider Note   CSN: [id number] Arrival date & time: 04/08/24  1641     Patient presents with: Shortness of Breath   Darren Ponce is a 74 y.o. male.  Patient with past medical history significant for hypertension, exertional dyspnea presents to the emergency department complaining of feeling short of breath.  He endorses intermittent random episodes where he feels that he cannot catch his breath.  These lasted for a few minutes and then improved.  He states he was seen at [facility] 2 days ago for the similar complaint with an extensive workup including CT angio dissection study and a coronary CT.  His workup at that time was grossly unremarkable.  He endorses continued chest pain (unchanged from previous admission), abdominal pain, nausea, vomiting, fever.  He he is grossly asymptomatic at this time.  Plans at time of discharge were for follow-up with both PCP and pulmonology.  He was cleared by cardiology.    Shortness of Breath      Prior to Admission medications  Medication Sig Start Date End Date Taking? Authorizing Provider  acetaminophen  (TYLENOL ) 500 MG tablet Take 1,000 mg by mouth every 6 (six) hours as needed for pain.    [provider]  famotidine  (PEPCID ) 20 MG tablet Take 1 tablet (20 mg total) by mouth 2 (two) times daily. 04/06/24 05/06/24  Christobal Guadalajara, MD  latanoprost  (XALATAN ) 0.005 % ophthalmic solution Place 1 drop into the left eye daily before breakfast. 04/29/19   [provider]  lisinopril  (ZESTRIL ) 5 MG tablet Take 1 tablet (5 mg total) by mouth at bedtime. 05/13/19   Petrucelli, Samantha R, PA-C  pravastatin  (PRAVACHOL ) 10 MG tablet Take 10 mg by mouth daily. 12/29/23   [provider]  Probiotic Product (PROBIOTIC PO) Take 1 tablet by mouth daily.    [provider]    Allergies: Amoxicillin-pot clavulanate and Codeine    Review of Systems  Respiratory:  Positive for  shortness of breath.     Updated Vital Signs BP 137/74   Pulse 72   Temp 97.7 F (36.5 C) (Oral)   Resp 18   Ht 5' 7 (1.702 m)   Wt 83.5 kg   SpO2 95%   BMI 28.82 kg/m   Physical Exam Vitals and nursing note reviewed.  Constitutional:      General: He is not in acute distress.    Appearance: He is well-developed.  HENT:     Head: Normocephalic and atraumatic.  Eyes:     Conjunctiva/sclera: Conjunctivae normal.  Cardiovascular:     Rate and Rhythm: Normal rate and regular rhythm.     Heart sounds: No murmur heard. Pulmonary:     Effort: Pulmonary effort is normal. No respiratory distress.     Breath sounds: Normal breath sounds.  Abdominal:     Palpations: Abdomen is soft.     Tenderness: There is no abdominal tenderness.  Musculoskeletal:        General: No swelling.     Cervical back: Neck supple.  Skin:    General: Skin is warm and dry.     Capillary Refill: Capillary refill takes less than 2 seconds.  Neurological:     Mental Status: He is alert.  Psychiatric:        Mood and Affect: Mood normal.     (all labs ordered are listed, but only abnormal results are displayed) Labs Reviewed  BASIC METABOLIC PANEL WITH  GFR - Abnormal; Notable for the following components:      Result Value   Glucose, Bld 150 (*)    All other components within normal limits  CBC  D-DIMER, QUANTITATIVE (NOT AT [facility])  TROPONIN T, HIGH SENSITIVITY    EKG: EKG Interpretation Date/Time:  Friday April 08 2024 22:00:08 EST Ventricular Rate:  74 PR Interval:  170 QRS Duration:  102 QT Interval:  396 QTC Calculation: 440 R Axis:   -1  Text Interpretation: Sinus rhythm Abnormal R-wave progression, early transition No significant change since prior 1/26 Confirmed by Towana Sharper [phone]) on 04/08/2024 10:35:04 PM  Radiology: DG Chest 2 View Result Date: 04/08/2024 EXAM: 2 VIEW(S) XRAY OF THE CHEST 04/08/2024 06:25:18 PM COMPARISON: 04/05/2024 CLINICAL HISTORY: shortness of  breath shortness of breath shortness of breath shortness of breath shortness of breath FINDINGS: LUNGS AND PLEURA: No focal pulmonary opacity. No pleural effusion. No pneumothorax. HEART AND MEDIASTINUM: No acute abnormality of the cardiac and mediastinal silhouettes. BONES AND SOFT TISSUES: Moderate thoracic spine degeneration. IMPRESSION: 1. No acute findings. Electronically signed by: Greig Pique MD 04/08/2024 07:05 PM EST RP Workstation: [id number]     Procedures   Medications Ordered in the ED - No data to display                                  Medical Decision Making  This patient presents to the ED for concern of shortness of breath, this involves an extensive number of treatment options, and is a complaint that carries with it a high risk of complications and morbidity.  The differential diagnosis includes anxiety, pneumonia, PE, ACS, dissection, symptomatic anemia, others   Co morbidities / Chronic conditions that complicate the patient evaluation  As noted in HPI   Additional history obtained:  Additional history obtained from EMR External records from outside source obtained and reviewed including recent discharge summary   Lab Tests:  I Ordered, and personally interpreted labs.  The pertinent results include: Grossly unremarkable CBC, BMP, D-dimer, troponin   Imaging Studies ordered:  I ordered imaging studies including chest x-ray I independently visualized and interpreted imaging which showed no acute findings I agree with the radiologist interpretation   Cardiac Monitoring: / EKG:  The patient was maintained on a cardiac monitor.  I personally viewed and interpreted the cardiac monitored which showed an underlying rhythm of: Sinus rhythm   Test / Admission - Considered:  Patient with no new findings on tonight's workup.  He had extensive imaging 2 days ago and I do not feel that repeat imaging at this time would yield any new results.  The patient has  oxygen saturations that are 95+ percent on room air.  His lungs are clear to auscultation.  He continues to endorse chest pain which was also present at the time of discharge after being cleared by cardiology.  At this time I feel the patient would be best served by outpatient follow-up with PCP and pulmonology as previously planned.  The patient is comfortable with this plan.  Return precautions provided.      Final diagnoses:  SOB (shortness of breath)    ED Discharge Orders     None          Logan Ubaldo KATHEE DEVONNA 04/09/24 0039    Theadore Sharper HERO, MD 04/09/24 [phone]  "

## 2024-04-14 ENCOUNTER — Ambulatory Visit (HOSPITAL_COMMUNITY)
Admission: RE | Admit: 2024-04-14 | Discharge: 2024-04-14 | Disposition: A | Discharge status: Home / Self Care | Source: Ambulatory Visit | Attending: Family Medicine | Admitting: Family Medicine

## 2024-04-14 ENCOUNTER — Other Ambulatory Visit (HOSPITAL_COMMUNITY): Payer: Self-pay | Admitting: Family Medicine

## 2024-04-14 DIAGNOSIS — K838 Other specified diseases of biliary tract: Secondary | ICD-10-CM | POA: Insufficient documentation

## 2024-04-14 MED ORDER — GADOBUTROL 1 MMOL/ML IV SOLN
8.0000 mL | Freq: Once | INTRAVENOUS | Status: AC | PRN
  Administered 2024-04-14: 8 mL via INTRAVENOUS

## 2024-04-15 ENCOUNTER — Telehealth (HOSPITAL_BASED_OUTPATIENT_CLINIC_OR_DEPARTMENT_OTHER): Payer: Self-pay

## 2024-04-15 NOTE — Telephone Encounter (Signed)
 Pt has and upcoming appointment on 1/28 clearance can be address at office visit.

## 2024-04-15 NOTE — Telephone Encounter (Signed)
" ° °  Name: IHAN PAT  DOB: 11-22-1950  MRN: [id number]  Primary Cardiologist: Newman [name], MD  Chart reviewed as part of pre-operative protocol coverage. Because of Jadrien Narine Calzada's past medical history and time since last visit, he will require a follow-up in-office visit in order to better assess preoperative cardiovascular risk.  Patient has appointment 1/28  Pre-op covering staff: - Please schedule appointment and call patient to inform them. If patient already had an upcoming appointment within acceptable timeframe, please add pre-op clearance to the appointment notes so provider is aware. - Please contact requesting surgeon's office via preferred method (i.e, phone, fax) to inform them of need for appointment prior to surgery.   Rollo FABIENE Louder, PA-C  04/15/2024, 3:50 PM   "

## 2024-04-15 NOTE — Telephone Encounter (Signed)
"  ° °  Pre-operative Risk Assessment    Patient Name: Darren Ponce  DOB: 1950/06/25 MRN: [id number]   Date of last office visit: NA Date of next office visit: 04/20/24 with Janene (NEW PT APPT)  Request for Surgical Clearance    Procedure:  Endo and colonoscopy  Date of Surgery:  Clearance 05/30/24                                  Surgeon:  Dr. Dianna Surgeon's Group or Practice Name:  [facility]  Phone number:  [phone] Fax number:  [phone]   Type of Clearance Requested:   - Medical    Type of Anesthesia:  propofol   Additional requests/questions:    Bonney Augustin JONETTA Delores   04/15/2024, 3:43 PM   "

## 2024-04-19 NOTE — Progress Notes (Unsigned)
 " Cardiology Office Note   Date:  04/20/2024  ID:  Ponce, Darren 1950-06-10, MRN [id number] PCP: Sun, Vyvyan, MD  [facility] Providers Cardiologist:  Newman [name], MD     History of Present Illness Darren Ponce is a 74 y.o. male with history of hypertension, hyperlipidemia, mild coronary artery stenosis, choledocholithiasis s/p cholecystectomy.    He presented to the ED 04/08/2024 with chest pain and shortness of breath. He had cough and viral illness 6 weeks ago and symptoms have been persistent since.  He did not note severe exertional dyspnea with minimal physical activity, particularly during sexual intercourse.    Workup in ER shows normal EKG, high sensitive troponin less than 15, and CTA ruled out acute aortic syndrome or pulmonary embolism.  There was an incidental finding of left hepatic pneumobilia.   Echo 04/05/2024 LVEF 55-60%, no RWMA, RV normal, trivial MR, and aortic valve sclerosis present without stenosis.  Coronary CTA with 04/06/2024 shows calcium score 21.1.  This was 19 percentile for age, sex, and race matched controls.  Normal coronary origin with right dominance, 25-49% distal RCA stenosis, and to consider 9 atherosclerotic causes of chest pain.  He was discharged 04/06/2024 with chest pain being atypical and noted possibly be related to GERD since stopping Prilosec.  Pepcid  was added.  He returned to the ED 04/08/2024 with shortness of breath, abdominal bloating intermittent lightheadedness. There was no new findings on workup. He was discharged on/17/2026 with recommendations to follow-up with PCP and pulmonology.    He presents today for hospital follow-up in the setting of chest pain shortness of breath.  He also presents for preop clearance for endoscopy and colonoscopy procedure on 05/30/2024. He is still having he shortness of breath at rest and during activity such as carrying wood in the house and intercourse. He notes occasional mid sternal chest  pain with his shortness of breath. He reports feeling like he cannot get enough air in. He is currently not exercising like he used to because he is scared he will get short of breath. He is able to walk up a flight of stairs and walk 2 blocks, he is just nervous. He is going on a cruise mid February and is eager to get some answers about his symptoms.  He denies lower extremity edema, fatigue, palpitations, melena, hematuria, hemoptysis, diaphoresis, weakness, presyncope, syncope, orthopnea, and PND.  ROS: All systems negative unless otherwise indicated in HPI.   Studies Reviewed EKG Interpretation Date/Time:  Wednesday April 20 2024 09:45:39 EST Ventricular Rate:  67 PR Interval:  168 QRS Duration:  82 QT Interval:  376 QTC Calculation: 397 R Axis:   12  Text Interpretation: Normal sinus rhythm Normal ECG When compared with ECG of 08-Apr-2024 22:00, PREVIOUS ECG IS PRESENT Confirmed by Teresa Fish ([id number]) on 04/20/2024 10:09:30 AM    Cardiac Studies & Procedures   ______________________________________________________________________________________________     ECHOCARDIOGRAM  ECHOCARDIOGRAM COMPLETE 04/05/2024  Narrative ECHOCARDIOGRAM REPORT    Patient Name:   Darren Ponce Date of Exam: 04/05/2024 Medical Rec #:  [id number]    Height:       70.0 in Accession #:    [id number]   Weight:       184.0 lb Date of Birth:  1950-06-09   BSA:          2.015 m Patient Age:    73 years     BP:  127/77 mmHg Patient Gender: M            HR:           73 bpm. Exam Location:  Inpatient  Procedure: 2D Echo, Cardiac Doppler, Color Doppler and Intracardiac Opacification Agent (Both Spectral and Color Flow Doppler were utilized during procedure).  Indications:    Chest Pain  History:        Patient has no prior history of Echocardiogram examinations. Signs/Symptoms:Dyspnea.  Sonographer:    Philomena Daring Referring Phys: ZANE ADAMS  IMPRESSIONS   1. Left ventricular  ejection fraction, by estimation, is 55 to 60%. The left ventricle has normal function. The left ventricle has no regional wall motion abnormalities. Left ventricular diastolic parameters were normal. 2. Right ventricular systolic function is normal. The right ventricular size is mildly enlarged. Tricuspid regurgitation signal is inadequate for assessing PA pressure. 3. The mitral valve is grossly normal. Trivial mitral valve regurgitation. No evidence of mitral stenosis. 4. The aortic valve is tricuspid. There is mild calcification of the aortic valve. Aortic valve regurgitation is not visualized. Aortic valve sclerosis is present, with no evidence of aortic valve stenosis.  FINDINGS Left Ventricle: Left ventricular ejection fraction, by estimation, is 55 to 60%. The left ventricle has normal function. The left ventricle has no regional wall motion abnormalities. Definity  contrast agent was given IV to delineate the left ventricular endocardial borders. The left ventricular internal cavity size was normal in size. There is no left ventricular hypertrophy. Left ventricular diastolic parameters were normal.  Right Ventricle: The right ventricular size is mildly enlarged. No increase in right ventricular wall thickness. Right ventricular systolic function is normal. Tricuspid regurgitation signal is inadequate for assessing PA pressure.  Left Atrium: Left atrial size was normal in size.  Right Atrium: Right atrial size was normal in size.  Pericardium: There is no evidence of pericardial effusion.  Mitral Valve: The mitral valve is grossly normal. Trivial mitral valve regurgitation. No evidence of mitral valve stenosis.  Tricuspid Valve: The tricuspid valve is grossly normal. Tricuspid valve regurgitation is trivial. No evidence of tricuspid stenosis.  Aortic Valve: The aortic valve is tricuspid. There is mild calcification of the aortic valve. Aortic valve regurgitation is not visualized. Aortic  valve sclerosis is present, with no evidence of aortic valve stenosis.  Pulmonic Valve: The pulmonic valve was grossly normal. Pulmonic valve regurgitation is not visualized. No evidence of pulmonic stenosis.  Aorta: The aortic root and ascending aorta are structurally normal, with no evidence of dilitation.  Venous: The inferior vena cava was not well visualized.  IAS/Shunts: The atrial septum is grossly normal.   LEFT VENTRICLE PLAX 2D LVIDd:         4.40 cm   Diastology LVIDs:         2.90 cm   LV e' medial:    7.83 cm/s LV PW:         1.00 cm   LV E/e' medial:  8.5 LV IVS:        1.00 cm   LV e' lateral:   10.10 cm/s LVOT diam:     2.30 cm   LV E/e' lateral: 6.6 LV SV:         81 LV SV Index:   40 LVOT Area:     4.15 cm LV IVRT:       99 msec   RIGHT VENTRICLE RV S prime:     11.50 cm/s TAPSE (M-mode): 2.3 cm  LEFT ATRIUM  Index        RIGHT ATRIUM           Index LA diam:        3.60 cm 1.79 cm/m   RA Area:     14.70 cm LA Vol (A2C):   45.6 ml 22.63 ml/m  RA Volume:   34.30 ml  17.03 ml/m LA Vol (A4C):   42.3 ml 21.00 ml/m LA Biplane Vol: 44.2 ml 21.94 ml/m AORTIC VALVE LVOT Vmax:   88.60 cm/s LVOT Vmean:  56.100 cm/s LVOT VTI:    0.195 m  AORTA Ao Root diam: 3.30 cm Ao Asc diam:  3.20 cm  MITRAL VALVE MV Area (PHT): 3.56 cm    SHUNTS MV Decel Time: 213 msec    Systemic VTI:  0.20 m MV E velocity: 66.60 cm/s  Systemic Diam: 2.30 cm MV A velocity: 81.80 cm/s MV E/A ratio:  0.81  Darryle Decent MD Electronically signed by Darryle Decent MD Signature Date/Time: 04/05/2024/3:55:17 PM    Final      CT SCANS  CT CORONARY MORPH W/CTA COR W/SCORE 04/06/2024  Addendum 04/06/2024  4:42 PM ADDENDUM REPORT: 04/06/2024 16:40  EXAM: OVER-READ INTERPRETATION  CT CHEST  The following report is an over-read performed by radiologist Dr. Vanetta Mortimer [facility] on 04/06/2024. This over-read does not include interpretation of  cardiac or coronary anatomy or pathology. The coronary CTA interpretation by the cardiologist is attached.  COMPARISON:  Chest CT dated 04/05/2024.  FINDINGS: The visualized thoracic aorta and central pulmonary arteries appear unremarkable.  No adenopathy noted. The visualized esophagus is grossly unremarkable.  The visualized lungs are clear.  No acute osseous pathology.  IMPRESSION: No acute extracardiac pathology.   Electronically Signed By: Vanetta Chou M.D. On: 04/06/2024 16:40  Narrative CLINICAL DATA:  Chest pain  EXAM: Cardiac/Coronary CTA  TECHNIQUE: A non-contrast, gated CT scan was obtained with axial slices of 3 mm through the heart for calcium scoring. Calcium scoring was performed using the Agatston method. A 120 kV prospective, gated, contrast cardiac scan was obtained. Gantry rotation speed was 250 msecs and collimation was 0.6 mm. Two sublingual nitroglycerin  tablets (0.8 mg) were given. The 3D data set was reconstructed in 5% intervals of the 35-75% of the R-R cycle. Diastolic phases were analyzed on a dedicated workstation using MPR, MIP, and VRT modes. The patient received 95 cc of contrast.  FINDINGS: Image quality: Excellent.  Noise artifact is: Limited.  Coronary Arteries:  Normal coronary origin.  Right dominance.  Left main: The left main is a large caliber vessel with a normal take off from the left coronary cusp that bifurcates to form a left anterior descending artery and a left circumflex artery. There is no plaque or stenosis.  Left anterior descending artery: The LAD is patent without evidence of plaque or stenosis. The LAD gives off 2 patent diagonal branches.  Left circumflex artery: The LCX is non-dominant and patent with no evidence of plaque or stenosis. The LCX gives off 2 patent obtuse marginal branches.  Right coronary artery: The RCA is dominant with normal take off from the right coronary cusp. Minimal (<25%)  calcified plaque prox RCA. Mild (25-49%) calcified plaque distal RCA. The RCA terminates as a PDA and right posterolateral branch without evidence of plaque or stenosis.  Right Atrium: Right atrial size is within normal limits.  Right Ventricle: The right ventricular cavity is within normal limits.  Left Atrium: Left atrial size is normal in size with  no left atrial appendage filling defect.  Left Ventricle: The ventricular cavity size is within normal limits.  Pulmonary arteries: Normal in size.  Pulmonary veins: Normal pulmonary venous drainage.  Pericardium: Normal thickness without significant effusion or calcium present.  Cardiac valves: The aortic valve is trileaflet without significant calcification. The mitral valve is normal without significant calcification.  Aorta: Normal caliber without significant disease.  Extra-cardiac findings: See attached radiology report for non-cardiac structures.  IMPRESSION: 1. Coronary calcium score of 21.1. This was 19th percentile for age-, sex, and race-matched controls.  2. Normal coronary origin with right dominance.  3. Mild atherosclerosis: 25-49% distal RCA.  4. Consider non atherosclerotic causes of chest pain.  RECOMMENDATIONS: 1. CAD-RADS 0: No evidence of CAD (0%). Consider non-atherosclerotic causes of chest pain.  2. CAD-RADS 1: Minimal non-obstructive CAD (0-24%). Consider non-atherosclerotic causes of chest pain. Consider preventive therapy and risk factor modification.  3. CAD-RADS 2: Mild non-obstructive CAD (25-49%). Consider non-atherosclerotic causes of chest pain. Consider preventive therapy and risk factor modification.  4. CAD-RADS 3: Moderate stenosis. Consider symptom-guided anti-ischemic pharmacotherapy as well as risk factor modification per guideline directed care. Additional analysis with CT FFR will be submitted.  5. CAD-RADS 4: Severe stenosis. (70-99% or > 50% left main).  Cardiac catheterization or CT FFR is recommended. Consider symptom-guided anti-ischemic pharmacotherapy as well as risk factor modification per guideline directed care. Invasive coronary angiography recommended with revascularization per published guideline statements.  6. CAD-RADS 5: Total coronary occlusion (100%). Consider cardiac catheterization or viability assessment. Consider symptom-guided anti-ischemic pharmacotherapy as well as risk factor modification per guideline directed care.  7. CAD-RADS N: Non-diagnostic study. Obstructive CAD can't be excluded. Alternative evaluation is recommended.  Wilbert Bihari, MD  Electronically Signed: By: Wilbert Bihari M.D. On: 04/06/2024 16:35     ______________________________________________________________________________________________      Risk Assessment/Calculations           Physical Exam VS:  BP 120/72 (BP Location: Left Arm, Patient Position: Sitting, Cuff Size: Normal)   Pulse 67   Ht 5' 10 (1.778 m)   Wt 179 lb 9.6 oz (81.5 kg)   SpO2 96%   BMI 25.77 kg/m        Wt Readings from Last 3 Encounters:  04/20/24 179 lb 9.6 oz (81.5 kg)  04/08/24 184 lb (83.5 kg)  04/05/24 184 lb (83.5 kg)    GEN: Well nourished, well developed in no acute distress NECK: No JVD; No carotid bruits CARDIAC: RRR, no murmurs, rubs, gallops RESPIRATORY:  Clear to auscultation without rales, wheezing or rhonchi  ABDOMEN: Soft, non-tender, non-distended EXTREMITIES:  No edema; No deformity   ASSESSMENT AND PLAN  Pre op clearance- Scheduled for endoscopy and colonoscopy 05/30/2024. According to the Revised Cardiac Risk Index (RCRI), his Perioperative Risk of Major Cardiac Event is (%): 0.4 His Functional Capacity in METs is: 5.29 according to the Duke Activity Status Index (DASI). -He is having shortness of breath associated with activity. He had cardiac workup in the hospital 04/06/24. Unremarkable. No ischemia or infarction noted. Echo  LVEF 55-60%. -Per AHA/ACC guidelines, he is deemed acceptable risk for the planned procedure without additional cardiovascular testing. Will route to surgical team so they are aware.  -Cleared from cardiac perspective. Recommend input from pulmonology that he is seeing tomorrow 1/29.  Coronary calcification noted on CTA/ LDL goal <70- Echo 04/05/2024 LVEF 55-60%, no RWMA, RV normal, trivial MR, and aortic valve sclerosis present without stenosis.  Coronary CTA with 04/06/2024 shows calcium score  21.1.  This was 19 percentile for age, sex, and race matched controls.  Normal coronary origin with right dominance, 25-49% distal RCA stenosis, and to consider 9 atherosclerotic causes of chest pain. -He is having shortness of breath and occasional mid sternal chest pain. With cardiac testing as above, can rule out cardiac etiology.  -Increase to pravastatin  20 mg.  -Repeat Lipid and LFT's in 4-6 week.   Hypertension- BP today 120/72. -BP well controlled without medication.   GAD- He did note that some of his pain could be contributed to anxiety. -He was started on clonazepam 0.5 mg PRN.  -He is going to talk with PCP about something to take regularly.  Shortness of breath- He notes shortness of breath as described above.  -Pulmonology appointment tomorrow.        Dispo: Follow up with Dr. Elmira in 6 months.   Signed, Mardy KATHEE Pizza, FNP  "

## 2024-04-20 ENCOUNTER — Encounter: Payer: Self-pay | Admitting: Physician Assistant

## 2024-04-20 ENCOUNTER — Ambulatory Visit

## 2024-04-20 VITALS — BP 120/72 | HR 67 | Ht 70.0 in | Wt 179.6 lb

## 2024-04-20 DIAGNOSIS — E785 Hyperlipidemia, unspecified: Secondary | ICD-10-CM | POA: Diagnosis not present

## 2024-04-20 DIAGNOSIS — R0602 Shortness of breath: Secondary | ICD-10-CM | POA: Diagnosis not present

## 2024-04-20 DIAGNOSIS — I10 Essential (primary) hypertension: Secondary | ICD-10-CM | POA: Diagnosis not present

## 2024-04-20 DIAGNOSIS — F411 Generalized anxiety disorder: Secondary | ICD-10-CM

## 2024-04-20 DIAGNOSIS — Z01818 Encounter for other preprocedural examination: Secondary | ICD-10-CM

## 2024-04-20 DIAGNOSIS — I2510 Atherosclerotic heart disease of native coronary artery without angina pectoris: Secondary | ICD-10-CM

## 2024-04-20 MED ORDER — PRAVASTATIN SODIUM 20 MG PO TABS: 20.0000 mg | ORAL_TABLET | Freq: Every day | ORAL | 3 refills | Status: AC

## 2024-04-20 NOTE — Patient Instructions (Addendum)
 Medication Instructions:  Please INCREASE your Pravastatin  to 20mg  per day.  *If you need a refill on your cardiac medications before your next appointment, please call your pharmacy*  Lab Work: REPEAT a FASTING Lipid and CMET in 1 month.  If you have labs (blood work) drawn today and your tests are completely normal, you will receive your results only by: MyChart Message (if you have MyChart) OR A paper copy in the mail If you have any lab test that is abnormal or we need to change your treatment, we will call you to review the results.   Follow-Up: At [facility], you and your health needs are our priority.  As part of our continuing mission to provide you with exceptional heart care, our providers are all part of one team.  This team includes your primary Cardiologist (physician) and Advanced Practice Providers or APPs (Physician Assistants and Nurse Practitioners) who all work together to provide you with the care you need, when you need it.  Your next appointment:   6 month(s)  Provider:   Dr. Elmira

## 2024-04-21 ENCOUNTER — Encounter: Payer: Self-pay | Admitting: Pulmonary Disease

## 2024-04-21 ENCOUNTER — Telehealth: Payer: Self-pay | Admitting: Pulmonary Disease

## 2024-04-21 ENCOUNTER — Ambulatory Visit: Admitting: Pulmonary Disease

## 2024-04-21 VITALS — BP 136/70 | HR 71 | Temp 98.3°F | Ht 70.0 in | Wt 179.0 lb

## 2024-04-21 DIAGNOSIS — Z87891 Personal history of nicotine dependence: Secondary | ICD-10-CM

## 2024-04-21 DIAGNOSIS — Z8709 Personal history of other diseases of the respiratory system: Secondary | ICD-10-CM

## 2024-04-21 DIAGNOSIS — R0600 Dyspnea, unspecified: Secondary | ICD-10-CM

## 2024-04-21 DIAGNOSIS — R0609 Other forms of dyspnea: Secondary | ICD-10-CM

## 2024-04-21 MED ORDER — PREDNISONE 10 MG PO TABS: 60.0000 mg | ORAL_TABLET | Freq: Every day | ORAL | 0 refills | Status: AC

## 2024-04-21 MED ORDER — BREZTRI AEROSPHERE 160-9-4.8 MCG/ACT IN AERO: 2.0000 | INHALATION_SPRAY | Freq: Two times a day (BID) | RESPIRATORY_TRACT | Status: DC

## 2024-04-21 MED ORDER — BREZTRI AEROSPHERE 160-9-4.8 MCG/ACT IN AERO: 2.0000 | INHALATION_SPRAY | Freq: Two times a day (BID) | RESPIRATORY_TRACT | 3 refills | Status: DC

## 2024-04-21 NOTE — Addendum Note (Signed)
 Addended by: MOODY RANCHER on: 04/21/2024 10:35 AM   Modules accepted: Orders

## 2024-04-21 NOTE — Progress Notes (Signed)
 "              OSA CAMPOLI    [id number]    [birth year]  Primary Care Physician:Sun, Vyvyan, MD  Referring Physician: Sun, Vyvyan, MD [phone] [name],  [place] [address]  Chief complaint: Consult for dyspnea  HPI: 74 y.o. who  has a past medical history of Choledocholithiasis (09/02/2012), Glaucoma, Hypertension, Kidney stone, and PONV (postoperative nausea and vomiting).  Discussed the use of AI scribe software for clinical note transcription with the patient, who gave verbal consent to proceed.  History of Present Illness Darren Ponce is a 74 year old male who presents with shortness of breath.  Dyspnea - Persistent shortness of breath following a six-week period of cough with mucus. - Dyspnea persists despite resolution of cough after amoxicillin therapy. - Requires deep breaths to feel relief, which is only brief. - Symptoms present at rest and with exertion, worsened by activity. - Intermittent chest discomfort localized to the lungs and sometimes below the sternum. - No benefit from prior use of albuterol  inhaler. - Concerned that breathing may limit participation in an upcoming family cruise.  Recent respiratory illness - Six-week history of cough with mucus prior to onset of persistent dyspnea. - Cough improved after a course of amoxicillin.  Cardiovascular history - Hospitalized on January 13th for evaluation of symptoms. - Diagnosed with mild coronary artery plaque during hospitalization. - Taking pravastatin  for hyperlipidemia.  Medication use - Currently taking pravastatin , lisinopril  for blood pressure, and clonazepam for anxiety. - Previously used albuterol  inhaler without benefit.   Relevant Pulmonary history: Pets: Used to have a dog.  No pets currently. 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  No feather pillow comforter No h/o  chemo/XRT/amiodarone/macrodantin/MTX  No exposure to asbestos, silica or other organic allergens  Smoking history: Minimal smoking history.  Tried it as a teenager. Travel history: No significant travel history Family history: Dad had emphysema.  He was a smoker.   Outpatient Encounter Medications as of 04/21/2024  Medication Sig   acetaminophen  (TYLENOL ) 500 MG tablet Take 1,000 mg by mouth every 6 (six) hours as needed for pain.   clonazePAM (KLONOPIN) 0.5 MG tablet 1/2 - 1 tab Orally Once a day as needed   famotidine  (PEPCID ) 20 MG tablet Take 1 tablet (20 mg total) by mouth 2 (two) times daily.   latanoprost  (XALATAN ) 0.005 % ophthalmic solution Place 1 drop into the left eye daily before breakfast.   lisinopril  (ZESTRIL ) 5 MG tablet Take 1 tablet (5 mg total) by mouth at bedtime.   pravastatin  (PRAVACHOL ) 20 MG tablet Take 1 tablet (20 mg total) by mouth daily.   Probiotic Product (PROBIOTIC PO) Take 1 tablet by mouth daily.   [DISCONTINUED] pravastatin  (PRAVACHOL ) 20 MG tablet Take 20 mg by mouth daily.   No facility-administered encounter medications on file as of 04/21/2024.     Physical Exam: Today's Vitals   04/21/24 0914  TempSrc: Oral  Weight: 179 lb (81.2 kg)  Height: 5' 10 (1.778 m)   Body mass index is 25.68 kg/m.  Physical Exam GEN: No acute distress CV: Regular rate and rhythm no murmurs LUNGS: Clear to auscultation bilaterally normal respiratory effort SKIN JOINTS: Warm and dry no rash    Data Reviewed: Imaging: CTA 04/05/2024-lungs are clear. CT coronary 04/06/2024-visualized lungs arterial Chest x-ray 04/08/2024-no acute cardiopulmonary abnormality I have reviewed the images personally.  PFTs:  Labs: D-dimer 04/08/2024-negative  Assessment and Plan Assessment & Plan Persistent dyspnea following acute bronchitis Persistent dyspnea post-acute bronchitis, likely due to residual bronchial or airway inflammation. Symptoms include shortness of breath  at rest and with exertion, without wheezing or crackles. Previous imaging during hospitalization and cardiac workup were unremarkable. Differential includes post-viral inflammation. No significant smoking history, reducing likelihood of COPD. Albuterol  inhaler previously ineffective. Anxiety related to dyspnea managed with clonazepam. - Prescribed Breztri  daily inhaler, two puffs in the morning and two puffs in the evening. - Prescribed prednisone  for one week to reduce inflammation. - Ordered lung function test at the hospital for expedited processing as he would like to get the test done before going on a schedule cruise next month. - Continue clonazepam for anxiety management.  Recommendations: Breztri , prednisone  taper PFTs at hospital  I personally spent a total of 60 minutes in the care of the patient today including preparing to see the patient, getting/reviewing separately obtained history, performing a medically appropriate exam/evaluation, placing orders, referring and communicating with other health care professionals, documenting clinical information in the EHR, communicating results, and coordinating care.   [name] MD [facility] 04/21/2024, 9:16 AM  CC: Sun, Vyvyan, MD   "

## 2024-04-21 NOTE — Telephone Encounter (Signed)
 Fax received from Dr. Jerrell Sol with [facility] to perform a colonocsopy under propofol  on patient.  Patient needs surgery clearance. Surgery is 05/30/24. Patient was seen on 04/21/24 . Office protocol is a risk assessment can be sent to surgeon if patient has been seen in 60 days or less.   Sending to Dr Theophilus for risk assessment or recommendations if patient needs to be seen in office prior to surgical procedure.   Please route risk assessment after pt is seen today, thanks!

## 2024-04-21 NOTE — Patient Instructions (Signed)
" °  VISIT SUMMARY: During your visit, we addressed your persistent shortness of breath following a recent respiratory illness. We discussed your symptoms, recent medical history, and current medications.  YOUR PLAN: PERSISTENT DYSPNEA FOLLOWING ACUTE BRONCHITIS: You have ongoing shortness of breath likely due to residual inflammation in your airways after a recent bout of bronchitis. -Use the prescribed breztri  daily inhaler: two puffs in the morning and two puffs in the evening. -Take prednisone  for one week to help reduce inflammation. -A lung function test has been ordered for you at the hospital to expedite processing. -Continue taking clonazepam to help manage anxiety related to your breathing issues.  Contains text generated by Abridge.   "

## 2024-04-25 ENCOUNTER — Ambulatory Visit: Admitting: Primary Care

## 2024-04-25 ENCOUNTER — Ambulatory Visit: Payer: Self-pay | Admitting: Pulmonary Disease

## 2024-04-25 DIAGNOSIS — Z87891 Personal history of nicotine dependence: Secondary | ICD-10-CM | POA: Diagnosis not present

## 2024-04-25 DIAGNOSIS — J984 Other disorders of lung: Secondary | ICD-10-CM | POA: Diagnosis not present

## 2024-04-25 DIAGNOSIS — R0609 Other forms of dyspnea: Secondary | ICD-10-CM | POA: Diagnosis not present

## 2024-04-25 DIAGNOSIS — J45998 Other asthma: Secondary | ICD-10-CM

## 2024-04-25 MED ORDER — FLUTICASONE-SALMETEROL 250-50 MCG/ACT IN AEPB: 1.0000 | INHALATION_SPRAY | Freq: Two times a day (BID) | RESPIRATORY_TRACT | 1 refills | Status: AC

## 2024-04-25 NOTE — Patient Instructions (Signed)
" °  VISIT SUMMARY: During your visit, we discussed your persistent shortness of breath that has been ongoing for six weeks following an episode of acute bronchitis. We reviewed your symptoms, current medications, and recent tests.  YOUR PLAN: -POST-VIRAL AIRWAY INFLAMMATION WITH PERSISTENT DYSPNEA: You have ongoing shortness of breath likely due to inflammation in your airways following a viral infection. We have discontinued the Breztri  inhaler as it may be too strong for your current condition and initiated Advair (fluticasone /salmeterol) inhaler, which you should use one puff twice daily. Please ensure you rinse your mouth after using the inhaler to prevent oral thrush. You can use albuterol  before Advair if it helps improve the effectiveness of the inhaler. Continue with the prednisone  taper as prescribed and proceed with the scheduled breathing test on Friday, avoiding inhaler use on the day of the test. Monitor your symptoms and report any changes via MyChart or phone call.  INSTRUCTIONS: Please proceed with the scheduled breathing test on Friday and avoid using your inhaler on the day of the test. Continue to monitor your symptoms and report any changes via MyChart or phone call.    Contains text generated by Abridge.   "

## 2024-04-25 NOTE — Telephone Encounter (Signed)
 FYI Only or Action Required?: Action required by provider: clinical question for provider, update on patient condition, and requesting alternate for breztri .  Patient is followed in Pulmonology for dyspnea, last seen on 04/21/2024 by Mannam, Praveen, MD.  Called Nurse Triage reporting Shortness of Breath.  Symptoms began a week ago.  Interventions attempted: Prescription medications: prednisone , breztri  - did not use breztri  last night due to SOB it gives him.  Symptoms are: gradually improving.  Triage Disposition: See HCP Within 4 Hours (Or PCP Triage)  Patient/caregiver understands and will follow disposition?: Yes        Reason for Disposition  [1] Longstanding difficulty breathing (e.g., CHF, COPD, emphysema) AND [2] WORSE than normal  Answer Assessment - Initial Assessment Questions This RN recommended pt be examined in next 4 hours for symptoms, pt requesting virtual appt via Facetime on his phone or phone call if at all possible, since MyChart doesn't work for him, he states. Pt requesting alternate med to Breztri  since acute worsening of SOB whenever he uses it, [facility] on [address]. Advised call back or seek immediate care if new or worsening symptoms.     1. RESPIRATORY STATUS: Describe your breathing? (e.g., wheezing, shortness of breath, unable to speak, severe coughing)      Been on prednisone  and breztri  for dyspnea since last week but breztri  doesn't seem to help, feels like breath not getting in there to his lungs especially right after taking breztri  Not needed to use rescue inhaler/nebulizer in the last week  3. PATTERN Does the difficult breathing come and go, or has it been constant since it started?      Worse with breztri  - When take it, feels like can't get the breath for a minute or 2, eventually I get it, but don't know if right fit  4. SEVERITY: How bad is your breathing? (e.g., mild, moderate, severe)      Yesterday it was a rough day, took breztri   yesterday morning but did not take it last night, just felt like it was not doing any good  6. CARDIAC HISTORY: Do you have any history of heart disease? (e.g., heart attack, angina, bypass surgery, angioplasty)      HTN  7. LUNG HISTORY: Do you have any history of lung disease?  (e.g., pulmonary embolus, asthma, emphysema)     Dyspnea  8. CAUSE: What do you think is causing the breathing problem?      Breztri  making it worse  9. OTHER SYMPTOMS: Do you have any other symptoms? (e.g., chest pain, cough, dizziness, fever, runny nose)     Chest pain has resolved since treatment Cough has gone away  Protocols used: Breathing Difficulty-A-AH

## 2024-04-25 NOTE — Telephone Encounter (Signed)
 Appt made for video visit today

## 2024-04-28 ENCOUNTER — Telehealth: Payer: Self-pay

## 2024-04-28 ENCOUNTER — Ambulatory Visit: Admitting: Physician Assistant

## 2024-04-28 NOTE — Telephone Encounter (Unsigned)
 Copied from CRM [id number]. Topic: Clinical - Medical Advice >> Apr 28, 2024 10:08 AM Joesph PARAS wrote: Reason for CRM: Patient is calling to state that he feels his breathing is not quite to 100%. Patient is not symptomatic and would like to speak to Dr. Theophilus prior to leaving for cruise Sunday 02/08. Patient unable to make appointment offered today, appointment not available for tomorrow at this time. Patient seen acutely 02/02.   Patient is requesting to have a follow-up scheduled with Dr. Theophilus after his PFT tomorrow despite not having appointment. Informed patient PAS will inquire but cannot guarantee. If not possible, patient would like to be medically advised prior to leaving for cruise.

## 2024-04-28 NOTE — Telephone Encounter (Signed)
 Patient will keep appointment tomorrow,patient states with the wixela inhaler that he feels more short of breath,and when is he in shower he throat begins tighten up.sending to dr.mannam to advise

## 2024-04-28 NOTE — Telephone Encounter (Signed)
 Please advise him to keep the PFT appointment tomorrow.  I will call him tomorrow after I review the results of the PFTs.

## 2024-04-29 ENCOUNTER — Ambulatory Visit (HOSPITAL_BASED_OUTPATIENT_CLINIC_OR_DEPARTMENT_OTHER)

## 2024-04-29 DIAGNOSIS — R0689 Other abnormalities of breathing: Secondary | ICD-10-CM

## 2024-04-29 LAB — PULMONARY FUNCTION TEST
DL/VA % pred: 131 %
DL/VA: 5.24 ml/min/mmHg/L
DLCO cor % pred: 123 %
DLCO cor: 31.33 ml/min/mmHg
DLCO unc % pred: 122 %
DLCO unc: 31.07 ml/min/mmHg
FEF 25-75 Post: 4.79 L/s
FEF 25-75 Pre: 3.91 L/s
FEF2575-%Change-Post: 22 %
FEF2575-%Pred-Post: 206 %
FEF2575-%Pred-Pre: 168 %
FEV1-%Change-Post: 13 %
FEV1-%Pred-Post: 114 %
FEV1-%Pred-Pre: 100 %
FEV1-Post: 3.59 L
FEV1-Pre: 3.17 L
FEV1FVC-%Change-Post: -2 %
FEV1FVC-%Pred-Pre: 121 %
FEV6-%Change-Post: 16 %
FEV6-%Pred-Post: 102 %
FEV6-%Pred-Pre: 87 %
FEV6-Post: 4.15 L
FEV6-Pre: 3.56 L
FEV6FVC-%Pred-Post: 106 %
FEV6FVC-%Pred-Pre: 106 %
FVC-%Change-Post: 16 %
FVC-%Pred-Post: 96 %
FVC-%Pred-Pre: 82 %
FVC-Post: 4.15 L
FVC-Pre: 3.56 L
Post FEV1/FVC ratio: 87 %
Post FEV6/FVC ratio: 100 %
Pre FEV1/FVC ratio: 89 %
Pre FEV6/FVC Ratio: 100 %
RV % pred: 104 %
RV: 2.63 L
TLC % pred: 99 %
TLC: 7.01 L

## 2024-04-29 MED ORDER — AZITHROMYCIN 250 MG PO TABS: ORAL_TABLET | ORAL | 0 refills | Status: AC

## 2024-04-29 MED ORDER — PREDNISONE 10 MG PO TABS: 40.0000 mg | ORAL_TABLET | Freq: Every day | ORAL | 0 refills | Status: AC

## 2024-04-29 NOTE — Telephone Encounter (Signed)
 Apt scheduled 05/19/2024. nfn

## 2024-04-29 NOTE — Telephone Encounter (Signed)
 Copied from CRM [id number]. Topic: Clinical - Lab/Test Results >> Apr 29, 2024 11:25 AM Darren Ponce wrote: Reason for CRM: Patient [phone] states had lung function test this morning at 10 am in [facility] location, and wants result and consult with medications from Dr. Theophilus. Please advise and call back today. Patient is leaving out of town Sunday morning, will be on a cruise on Monday.   I called and spoke to pt. I informed pt that Dr Theophilus was currently working in the hospital so I could not guarantee that he would be told his PFT results today however, we would be in contact with him as soon as Dr Theophilus does get a chance to look at the PFT. Pt verbalized understanding. NFN   Dr Theophilus, please advise results when you can.

## 2024-04-29 NOTE — Patient Instructions (Signed)
 Full PFT performed today.

## 2024-04-29 NOTE — Addendum Note (Signed)
 Addended byBETHA THEOPHILUS ROOSEVELT on: 04/29/2024 12:29 PM   Modules accepted: Orders

## 2024-04-29 NOTE — Progress Notes (Signed)
 Full PFT performed today.

## 2024-04-29 NOTE — Telephone Encounter (Signed)
 I called and discussed with patient.  Reviewed PFTs which showed changes consistent with asthma Advised him to continue the Wixela inhaler regularly twice daily and albuterol  as needed I have called in additional prednisone  and Z-Pak in case he needs it for his upcoming cruise  Please call him and make a sooner appointment with me later this month in February to check on him again.

## 2024-05-09 ENCOUNTER — Ambulatory Visit: Admitting: Physical Therapy

## 2024-05-19 ENCOUNTER — Ambulatory Visit: Admitting: Pulmonary Disease

## 2024-05-31 ENCOUNTER — Encounter

## 2024-06-13 ENCOUNTER — Ambulatory Visit: Admitting: Pulmonary Disease

## 2024-07-01 ENCOUNTER — Institutional Professional Consult (permissible substitution): Admitting: Neurology
